# Patient Record
Sex: Male | Born: 1980 | Race: White | Hispanic: No | Marital: Single
Health system: Southern US, Community
[De-identification: ages and names within clinical notes are randomized; demographics above are authoritative.]

---

## 2001-06-01 ENCOUNTER — Emergency Department (HOSPITAL_COMMUNITY): Admission: EM | Admit: 2001-06-01 | Discharge: 2001-06-02 | Payer: Self-pay | Admitting: Emergency Medicine

## 2001-06-01 ENCOUNTER — Encounter: Payer: Self-pay | Admitting: Emergency Medicine

## 2002-11-26 ENCOUNTER — Emergency Department (HOSPITAL_COMMUNITY): Admission: EM | Admit: 2002-11-26 | Discharge: 2002-11-26 | Payer: Self-pay | Admitting: Emergency Medicine

## 2004-05-18 ENCOUNTER — Emergency Department (HOSPITAL_COMMUNITY): Admission: EM | Admit: 2004-05-18 | Discharge: 2004-05-18 | Payer: Self-pay | Admitting: Family Medicine

## 2006-06-08 IMAGING — CR DG ABDOMEN 1V
2 series · 2 of 2 positions shown · non-contrast
Comparison: none

CLINICAL DATA: Left-sided abdominal pain for approximately three weeks.  Throat sore. 
 SINGLE VIEW ABDOMEN:
 AP views of the abdomen show a moderate amount of fecal material within the right, transverse, and sigmoid colon regions.  No definite obstruction, perforation, mass, or abnormal calcifications is seen.  The lumbar spine shows mild focal lumbar scoliosis but no other abnormality.

[view not recorded (1 of 2)]
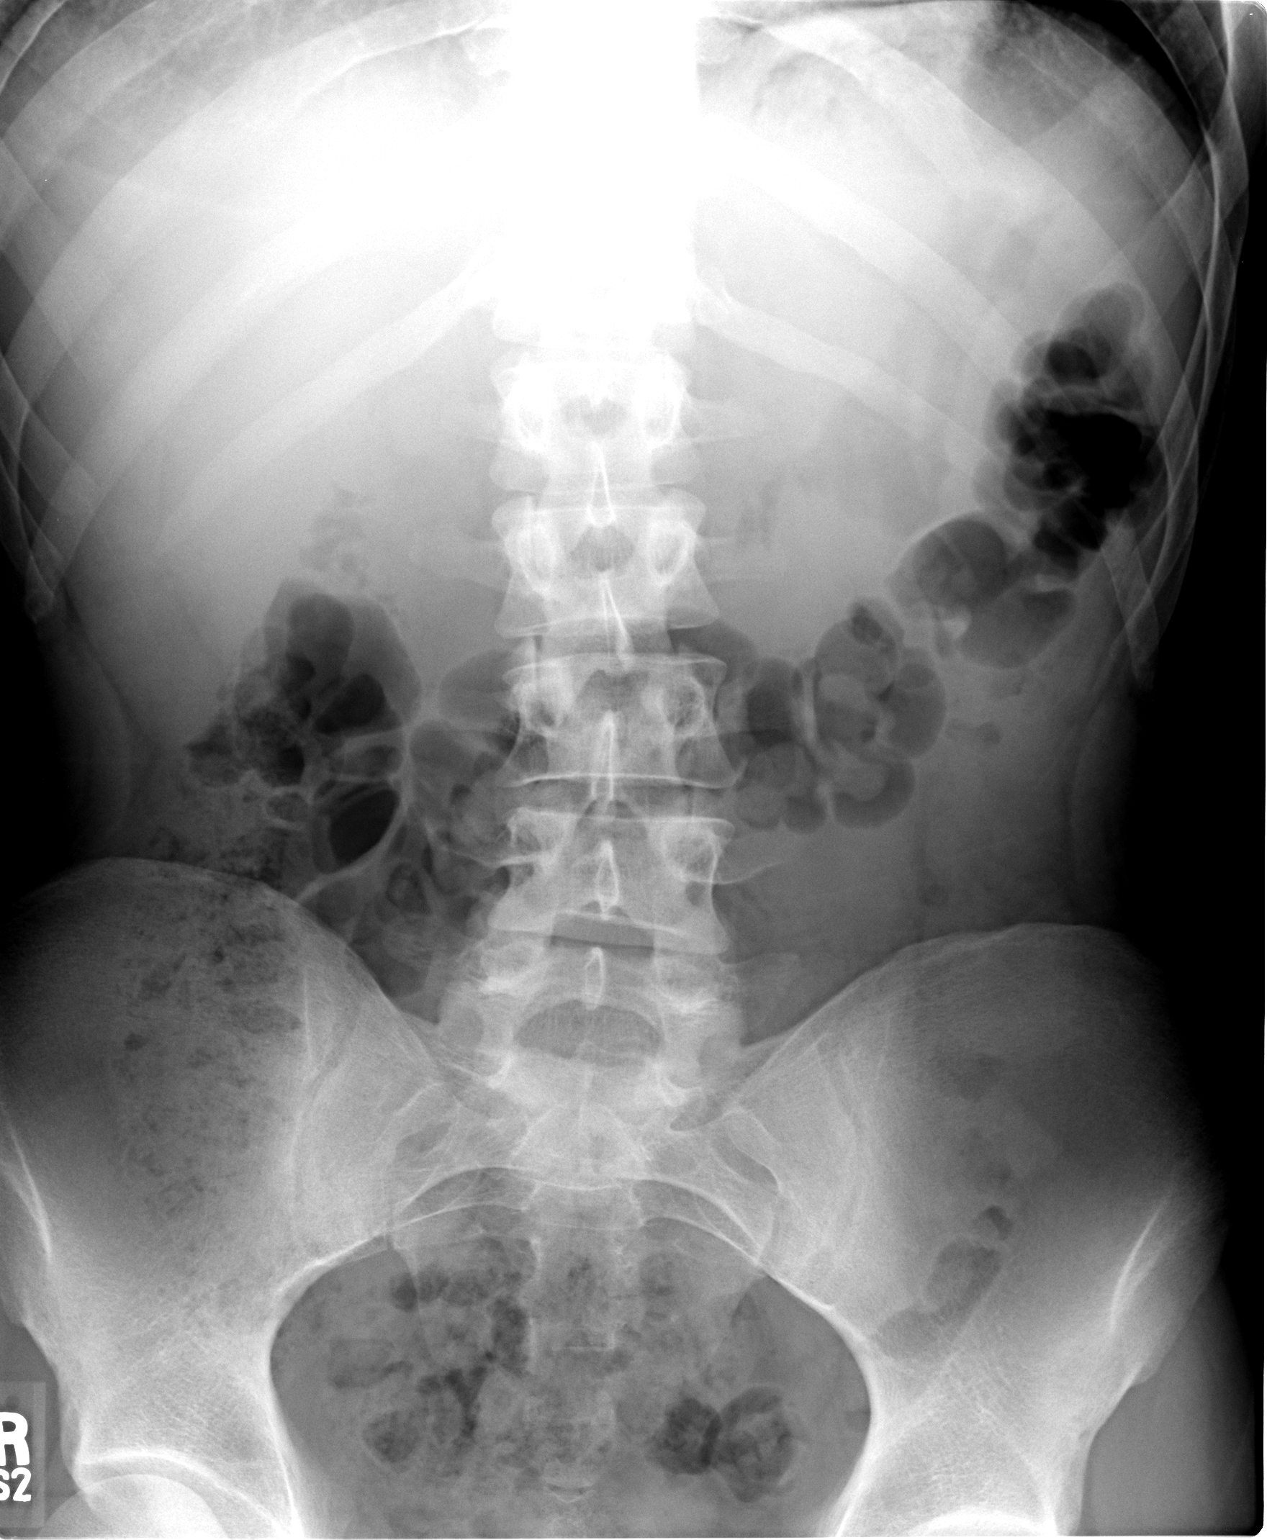

[view not recorded (2 of 2)]
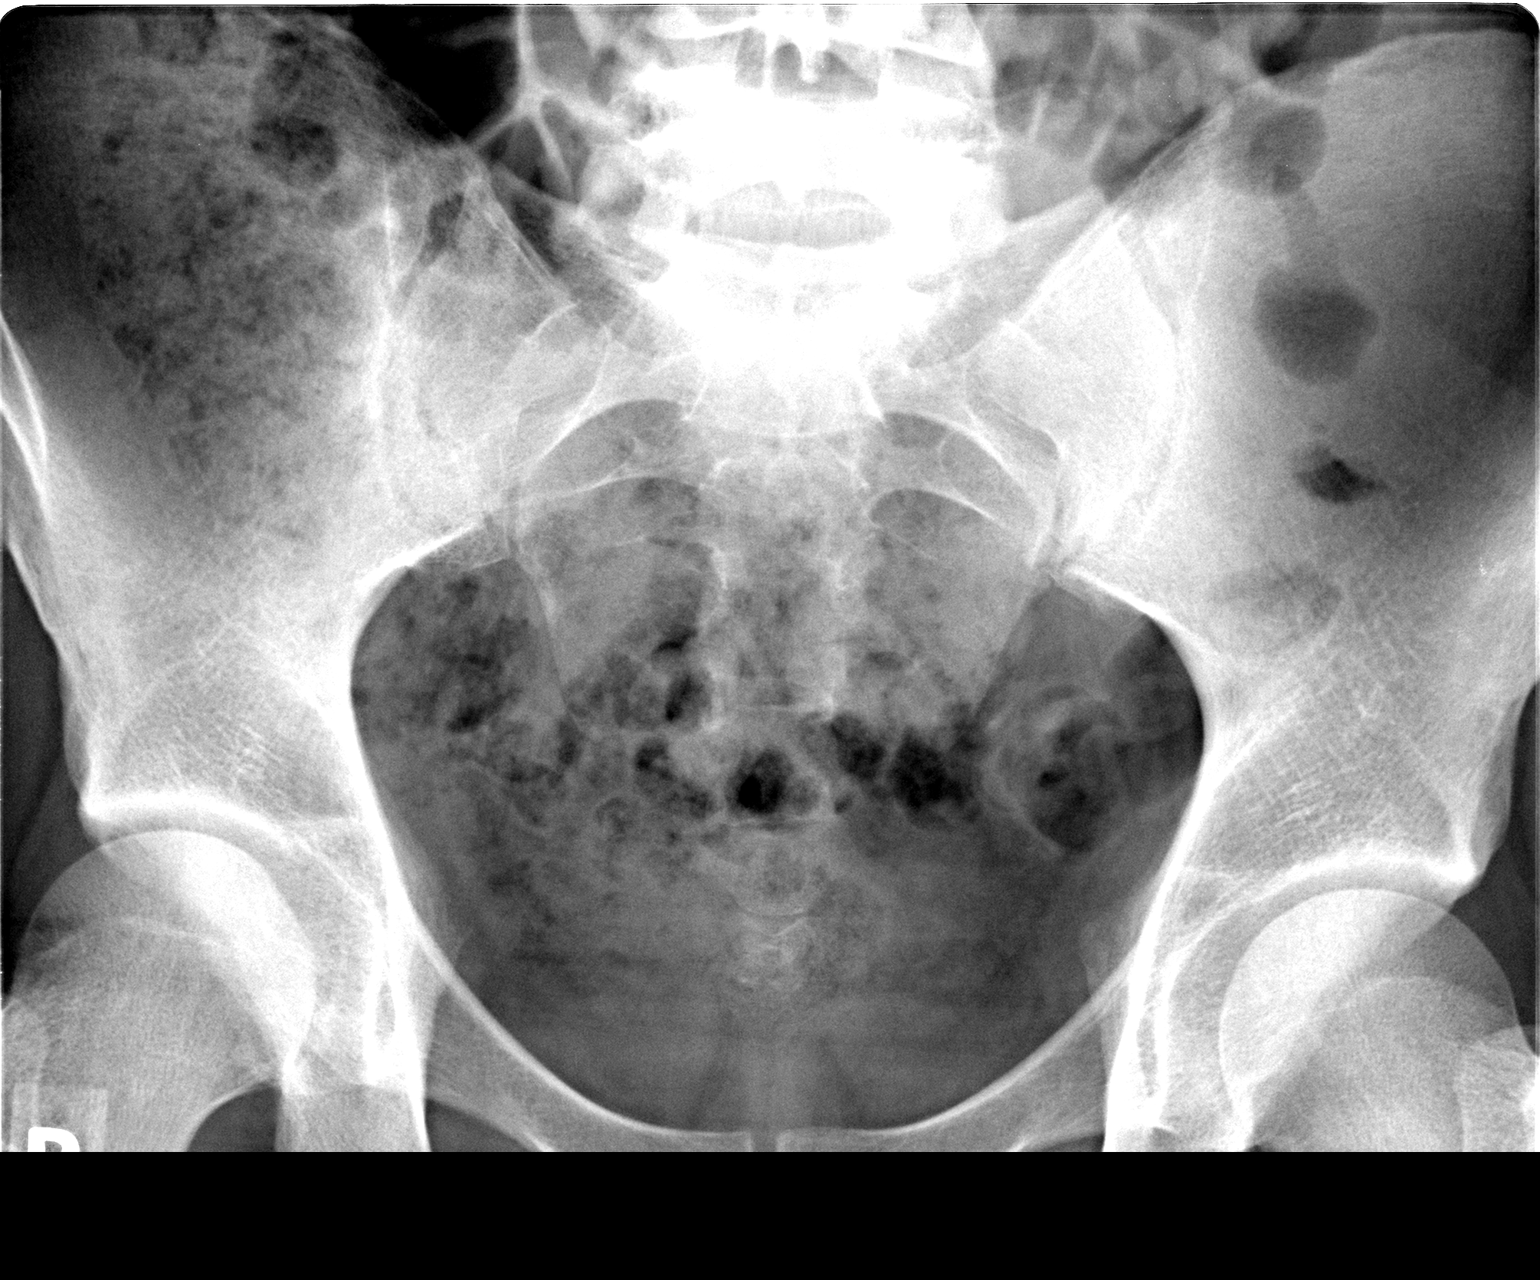

[2 of 2 positions shown; findings below may reference images not displayed]

IMPRESSION: Mildly increased fecal material in the right, transverse, and sigmoid colon.  No definite fecal impaction, obstruction, or free air.

## 2020-11-13 ENCOUNTER — Other Ambulatory Visit: Payer: Self-pay

## 2020-11-13 ENCOUNTER — Other Ambulatory Visit (HOSPITAL_COMMUNITY)
Admission: EM | Admit: 2020-11-13 | Discharge: 2020-11-17 | Disposition: A | Discharge status: Home / Self Care | Payer: No Payment, Other | Attending: Psychiatry | Admitting: Psychiatry

## 2020-11-13 ENCOUNTER — Encounter (HOSPITAL_COMMUNITY): Payer: Self-pay | Admitting: Registered Nurse

## 2020-11-13 DIAGNOSIS — F319 Bipolar disorder, unspecified: Secondary | ICD-10-CM | POA: Insufficient documentation

## 2020-11-13 DIAGNOSIS — F1094 Alcohol use, unspecified with alcohol-induced mood disorder: Secondary | ICD-10-CM | POA: Diagnosis present

## 2020-11-13 DIAGNOSIS — Z9151 Personal history of suicidal behavior: Secondary | ICD-10-CM | POA: Insufficient documentation

## 2020-11-13 DIAGNOSIS — F10239 Alcohol dependence with withdrawal, unspecified: Secondary | ICD-10-CM | POA: Insufficient documentation

## 2020-11-13 DIAGNOSIS — G4700 Insomnia, unspecified: Secondary | ICD-10-CM | POA: Insufficient documentation

## 2020-11-13 DIAGNOSIS — M545 Low back pain, unspecified: Secondary | ICD-10-CM | POA: Insufficient documentation

## 2020-11-13 DIAGNOSIS — Z91128 Patient's intentional underdosing of medication regimen for other reason: Secondary | ICD-10-CM | POA: Insufficient documentation

## 2020-11-13 DIAGNOSIS — F1024 Alcohol dependence with alcohol-induced mood disorder: Secondary | ICD-10-CM | POA: Insufficient documentation

## 2020-11-13 DIAGNOSIS — F1020 Alcohol dependence, uncomplicated: Secondary | ICD-10-CM | POA: Diagnosis not present

## 2020-11-13 DIAGNOSIS — R45851 Suicidal ideations: Secondary | ICD-10-CM

## 2020-11-13 DIAGNOSIS — Z20822 Contact with and (suspected) exposure to covid-19: Secondary | ICD-10-CM | POA: Diagnosis not present

## 2020-11-13 DIAGNOSIS — Z7982 Long term (current) use of aspirin: Secondary | ICD-10-CM | POA: Insufficient documentation

## 2020-11-13 DIAGNOSIS — F419 Anxiety disorder, unspecified: Secondary | ICD-10-CM | POA: Insufficient documentation

## 2020-11-13 DIAGNOSIS — T43596A Underdosing of other antipsychotics and neuroleptics, initial encounter: Secondary | ICD-10-CM | POA: Insufficient documentation

## 2020-11-13 DIAGNOSIS — K5900 Constipation, unspecified: Secondary | ICD-10-CM | POA: Insufficient documentation

## 2020-11-13 DIAGNOSIS — J45909 Unspecified asthma, uncomplicated: Secondary | ICD-10-CM | POA: Insufficient documentation

## 2020-11-13 LAB — COMPREHENSIVE METABOLIC PANEL
ALT: 15 U/L (ref 0–44)
AST: 22 U/L (ref 15–41)
Albumin: 4 g/dL (ref 3.5–5.0)
Alkaline Phosphatase: 63 U/L (ref 38–126)
Anion gap: 9 (ref 5–15)
BUN: 9 mg/dL (ref 6–20)
CO2: 26 mmol/L (ref 22–32)
Calcium: 9.1 mg/dL (ref 8.9–10.3)
Chloride: 106 mmol/L (ref 98–111)
Creatinine, Ser: 0.87 mg/dL (ref 0.61–1.24)
GFR, Estimated: >60 mL/min (ref >60)
Glucose, Bld: 88 mg/dL (ref 70–99)
Potassium: 4.1 mmol/L (ref 3.5–5.1)
Sodium: 141 mmol/L (ref 135–145)
Total Bilirubin: 0.4 mg/dL (ref 0.3–1.2)
Total Protein: 6.7 g/dL (ref 6.5–8.1)

## 2020-11-13 LAB — URINALYSIS, ROUTINE W REFLEX MICROSCOPIC
Bilirubin Urine: NEGATIVE
Glucose, UA: NEGATIVE mg/dL
Hgb urine dipstick: NEGATIVE
Ketones, ur: NEGATIVE mg/dL
Leukocytes,Ua: NEGATIVE
Nitrite: NEGATIVE
Protein, ur: NEGATIVE mg/dL
Specific Gravity, Urine: 1.017 (ref 1.005–1.030)
pH: 5 (ref 5.0–8.0)

## 2020-11-13 LAB — RESP PANEL BY RT-PCR (FLU A&B, COVID) ARPGX2
Influenza A by PCR: NEGATIVE
Influenza B by PCR: NEGATIVE
SARS Coronavirus 2 by RT PCR: NEGATIVE

## 2020-11-13 LAB — POCT URINE DRUG SCREEN - MANUAL ENTRY (I-SCREEN)
POC Buprenorphine (BUP): NOT DETECTED
POC Cocaine UR: NOT DETECTED
POC Marijuana UR: NOT DETECTED
POC Morphine: NOT DETECTED
POC Oxazepam (BZO): NOT DETECTED
POC Oxycodone UR: NOT DETECTED
POC Secobarbital (BAR): NOT DETECTED

## 2020-11-13 LAB — CBC WITH DIFFERENTIAL/PLATELET
Abs Immature Granulocytes: 0.03 10*3/uL (ref 0.00–0.07)
Basophils Absolute: 0 10*3/uL (ref 0.0–0.1)
Basophils Relative: 0 %
Eosinophils Absolute: 0.2 10*3/uL (ref 0.0–0.5)
Eosinophils Relative: 2 %
HCT: 46.7 % (ref 39.0–52.0)
Hemoglobin: 16 g/dL (ref 13.0–17.0)
Immature Granulocytes: 0 %
Lymphocytes Relative: 29 %
Lymphs Abs: 2.4 10*3/uL (ref 0.7–4.0)
MCH: 33.1 pg (ref 26.0–34.0)
MCHC: 34.3 g/dL (ref 30.0–36.0)
MCV: 96.7 fL (ref 80.0–100.0)
Monocytes Absolute: 0.4 10*3/uL (ref 0.1–1.0)
Monocytes Relative: 6 %
Neutro Abs: 5 10*3/uL (ref 1.7–7.7)
Neutrophils Relative %: 63 %
Platelets: 207 10*3/uL (ref 150–400)
RBC: 4.83 MIL/uL (ref 4.22–5.81)
RDW: 12.7 % (ref 11.5–15.5)
WBC: 8 10*3/uL (ref 4.0–10.5)
nRBC: 0 % (ref 0.0–0.2)

## 2020-11-13 LAB — LIPID PANEL
Cholesterol: 195 mg/dL (ref 0–200)
HDL: 89 mg/dL (ref >40)
LDL Cholesterol: 80 mg/dL (ref 0–99)
Total CHOL/HDL Ratio: 2.2 RATIO
Triglycerides: 129 mg/dL (ref <150)
VLDL: 26 mg/dL (ref 0–40)

## 2020-11-13 LAB — HEMOGLOBIN A1C
Hgb A1c MFr Bld: 5.4 % (ref 4.8–5.6)
Mean Plasma Glucose: 108.28 mg/dL

## 2020-11-13 LAB — POCT URINE DRUG SCREEN - MANUAL ENTRY (I-CUP)
POC Amphetamine UR: NOT DETECTED
POC Methadone UR: NOT DETECTED
POC Methamphetamine UR: NOT DETECTED

## 2020-11-13 LAB — ETHANOL: Alcohol, Ethyl (B): 13 mg/dL — ABNORMAL HIGH (ref <10)

## 2020-11-13 LAB — POC SARS CORONAVIRUS 2 AG -  ED: SARS Coronavirus 2 Ag: NEGATIVE

## 2020-11-13 LAB — POC SARS CORONAVIRUS 2 AG: SARSCOV2ONAVIRUS 2 AG: NEGATIVE

## 2020-11-13 LAB — TSH: TSH: 0.771 u[IU]/mL (ref 0.350–4.500)

## 2020-11-13 LAB — MAGNESIUM: Magnesium: 2.4 mg/dL (ref 1.7–2.4)

## 2020-11-13 MED ORDER — NICOTINE 21 MG/24HR TD PT24
21.0000 mg | MEDICATED_PATCH | Freq: Every day | TRANSDERMAL | Status: DC
  Administered 2020-11-13 – 2020-11-16 (×4): 21 mg via TRANSDERMAL
  Filled 2020-11-13 (×4): qty 1

## 2020-11-13 MED ORDER — ADULT MULTIVITAMIN W/MINERALS CH
1.0000 | ORAL_TABLET | Freq: Every day | ORAL | Status: DC
  Administered 2020-11-13 – 2020-11-17 (×5): 1 via ORAL
  Filled 2020-11-13 (×5): qty 1

## 2020-11-13 MED ORDER — THIAMINE HCL 100 MG PO TABS
100.0000 mg | ORAL_TABLET | Freq: Every day | ORAL | Status: DC
  Administered 2020-11-14 – 2020-11-17 (×4): 100 mg via ORAL
  Filled 2020-11-13 (×4): qty 1

## 2020-11-13 MED ORDER — LORAZEPAM 1 MG PO TABS
1.0000 mg | ORAL_TABLET | Freq: Two times a day (BID) | ORAL | Status: AC
  Administered 2020-11-16 (×2): 1 mg via ORAL
  Filled 2020-11-13 (×2): qty 1

## 2020-11-13 MED ORDER — ALUM & MAG HYDROXIDE-SIMETH 200-200-20 MG/5ML PO SUSP: 30.0000 mL | ORAL | Status: DC | PRN

## 2020-11-13 MED ORDER — ONDANSETRON 4 MG PO TBDP: 4.0000 mg | ORAL_TABLET | Freq: Four times a day (QID) | ORAL | Status: AC | PRN

## 2020-11-13 MED ORDER — THIAMINE HCL 100 MG/ML IJ SOLN
100.0000 mg | Freq: Once | INTRAMUSCULAR | Status: AC
  Administered 2020-11-13: 100 mg via INTRAMUSCULAR
  Filled 2020-11-13: qty 2

## 2020-11-13 MED ORDER — LORAZEPAM 1 MG PO TABS
1.0000 mg | ORAL_TABLET | Freq: Every day | ORAL | Status: AC
  Administered 2020-11-17: 1 mg via ORAL
  Filled 2020-11-13: qty 1

## 2020-11-13 MED ORDER — MAGNESIUM HYDROXIDE 400 MG/5ML PO SUSP
30.0000 mL | Freq: Every day | ORAL | Status: DC | PRN
Start: 2020-11-13 — End: 2020-11-17

## 2020-11-13 MED ORDER — LORAZEPAM 1 MG PO TABS
1.0000 mg | ORAL_TABLET | Freq: Four times a day (QID) | ORAL | Status: AC
  Administered 2020-11-13 – 2020-11-14 (×6): 1 mg via ORAL
  Filled 2020-11-13 (×6): qty 1

## 2020-11-13 MED ORDER — ACETAMINOPHEN 325 MG PO TABS: 650.0000 mg | ORAL_TABLET | Freq: Four times a day (QID) | ORAL | Status: DC | PRN

## 2020-11-13 MED ORDER — LORAZEPAM 1 MG PO TABS
1.0000 mg | ORAL_TABLET | Freq: Three times a day (TID) | ORAL | Status: AC
  Administered 2020-11-15 (×3): 1 mg via ORAL
  Filled 2020-11-13 (×3): qty 1

## 2020-11-13 MED ORDER — TRAZODONE HCL 50 MG PO TABS
50.0000 mg | ORAL_TABLET | Freq: Every evening | ORAL | Status: DC | PRN
  Administered 2020-11-14 – 2020-11-16 (×2): 50 mg via ORAL
  Filled 2020-11-13: qty 7
  Filled 2020-11-13 (×2): qty 1

## 2020-11-13 MED ORDER — LOPERAMIDE HCL 2 MG PO CAPS: 2.0000 mg | ORAL_CAPSULE | ORAL | Status: AC | PRN

## 2020-11-13 MED ORDER — HYDROXYZINE HCL 25 MG PO TABS: 25.0000 mg | ORAL_TABLET | Freq: Four times a day (QID) | ORAL | Status: AC | PRN

## 2020-11-13 MED ORDER — LORAZEPAM 1 MG PO TABS: 1.0000 mg | ORAL_TABLET | Freq: Four times a day (QID) | ORAL | Status: AC | PRN

## 2020-11-13 NOTE — Progress Notes (Signed)
   11/13/20 1506  [facility] Triage Screening (Walk-ins at [facility] only)  What Is the Reason for Your Visit/Call Today? Subustance Use  How Long Has This Been Causing You Problems? 1 wk - 1 month  Have You Recently Had Any Thoughts About Hurting Yourself? Yes  How long ago did you have thoughts about hurting yourself? Pt reports "I want to walk in front of train"  Are You Planning to Commit Suicide/Harm Yourself At This time? No  Have you Recently Had Thoughts About Hurting Someone Karolee Ohs? No  Are You Planning To Harm Someone At This Time? No  Are you currently experiencing any auditory, visual or other hallucinations? No  Have You Used Any Alcohol or Drugs in the Past 24 Hours? Yes  How long ago did you use Drugs or Alcohol? 01/14/20  What Did You Use and How Much? Alcohol  Do you have any current medical co-morbidities that require immediate attention? No  Clinician description of patient physical appearance/behavior: cooperative  What Do You Feel Would Help You the Most Today? Treatment for Depression or other mood problem;Alcohol or Drug Use Treatment  If access to [facility] was not available, would you have sought care in the Emergency Department? Yes  Determination of Need Urgent (48 hours)  Options For Referral Medication Management;Facility-Based Crisis

## 2020-11-13 NOTE — ED Notes (Signed)
Pt admitted to [facility] endorsing  passive SI, worsening depression with feelings of hopelessness. Patient denied SI,HI,AVH at present. Patient was cooperative during the admission assessment. Skin assessment complete. Belongings inventoried. Patient oriented to unit and unit rules. Meal and drinks offered to patient.  Patient verbalized agreement to treatment plans. Patient verbally contracts for safety while hospitalized. Will monitor for safety.

## 2020-11-13 NOTE — ED Notes (Signed)
Pt is sleeping in the bed. Respirations are even and unlabored. No acute distress noted. Will continue to monitor for safety. °

## 2020-11-13 NOTE — ED Provider Notes (Signed)
Behavioral Health Admission [facility]&P [facility] & OBS)  Date: 11/13/20 Patient Name: Tom Davenport MRN: [id number] Chief Complaint:  Chief Complaint  Patient presents with   Addiction Problem   Suicidal      Diagnoses:  Final diagnoses:  Alcohol use disorder, severe, dependence (HCC)  Alcohol-induced mood disorder with depressive symptoms (HCC)  Passive suicidal ideations    HPI: Tom Davenport, 40 y.o., male patient presents to [facility] as a walk-in accompanied by his mother with complaints of alcohol use disorder, worsening depression with feelings of hopelessness, and passive suicidal thoughts. Patient seen face to face by this provider, consulted with Dr. Earlene Plater; and chart reviewed on 11/13/20.  On evaluation Tom Davenport reports he came to urgent care today seeking help for his alcohol abuse.  "I've lost all hope of anything good.  I'm not able to handle it anymore."  Patient reports he does have a psychiatric history.  At one time he had outpatient psychiatric services at [facility].  States he was diagnosed with bipolar disorder and started on medication possibly Abilify that he has not taken in over 6 months.  Reports stopped taking medication because he felt the medication made him worse.  Patient reports he has been drinking daily for the last 6 months.  Reports he drinks a six pack a day through the week and at least to 18 packs of beer over the weekend.  Patient states he is employed at [employer] and a second job at [employer].  Patient lives in [place] with his nephew.  Denies prior rehab.  Patient does endorse that he has had 1 prior suicide attempt when he was a teenager "I had again it was going to blow my head off."  Patient reports he was afraid to carry out suicide attempt.  No prior psychiatric hospitalizations.  At this time patient denies homicidal ideation, psychosis, paranoia. During evaluation Tom Davenport is alert/oriented x 4; calm/cooperative; and  mood is congruent with affect.  He does not appear to be responding to internal/external stimuli or delusional thoughts.  Patient denies homicidal ideation, psychosis, and paranoia.  Continues to endorse passive suicidal thoughts such as walking in front of her trying but stating he would never carry it out because he is afraid to kill himself.  Patient answered question appropriately.  Patient's mother is at his side.  Family is supportive.  Mother stating she feels that he needs to get help and she is afraid that he would carry out a suicide attempt.  Reporting family never see them anymore.  PHQ 2-9:     Total Time spent with patient: 45 minutes  Musculoskeletal  Strength & Muscle Tone: within normal limits Gait & Station: normal Patient leans: N/A  Psychiatric Specialty Exam  Presentation General Appearance: Appropriate for Environment  Eye Contact:Good  Speech:Clear and Coherent; Normal Rate  Speech Volume:Normal  Handedness:Right   Mood and Affect  Mood:Anxious; Depressed; Hopeless  Affect:Congruent; Depressed   Thought Process  Thought Processes:Coherent; Goal Directed  Descriptions of Associations:Intact  Orientation:Full (Time, Place and Person)  Thought Content:WDL    Hallucinations:Hallucinations: None  Ideas of Reference:None  Suicidal Thoughts:Suicidal Thoughts: Yes, Passive SI Passive Intent and/or Plan: Without Intent; With Plan (Has thought about walking in front of train)  Homicidal Thoughts:Homicidal Thoughts: No   Sensorium  Memory:Immediate Good; Recent Good; Remote Good  Judgment:No data recorded Insight:Fair; Present   Executive Functions  Concentration:Good  Attention Span:Good  Recall:Good  Fund of Knowledge:Good  Language:No data recorded  Psychomotor Activity  Psychomotor Activity:Psychomotor Activity: Normal   Assets  Assets:Communication Skills; Desire for Improvement; Housing; Resilience; Social Support   Sleep   Sleep:Sleep: Fair   Nutritional Assessment (For OBS and [facility]BC admissions only) Has the patient had a weight loss or gain of 10 pounds or more in the last 3 months?: No Has the patient had a decrease in food intake/or appetite?: No Does the patient have dental problems?: No Does the patient have eating habits or behaviors that may be indicators of an eating disorder including binging or inducing vomiting?: No Has the patient recently lost weight without trying?: 0 Has the patient been eating poorly because of a decreased appetite?: 0 Malnutrition Screening Tool Score: 0    Physical Exam Vitals and nursing note reviewed. Exam conducted with a chaperone present.  Constitutional:      General: He is not in acute distress.    Appearance: Normal appearance. He is not ill-appearing.  Cardiovascular:     Rate and Rhythm: Normal rate.  Pulmonary:     Effort: Pulmonary effort is normal.  Musculoskeletal:        General: Normal range of motion.     Cervical back: Normal range of motion.  Skin:    General: Skin is warm and dry.  Neurological:     Mental Status: He is alert and oriented to person, place, and time.  Psychiatric:        Attention and Perception: Attention and perception normal. He does not perceive auditory or visual hallucinations.        Mood and Affect: Mood is anxious and depressed.        Speech: Speech normal.        Behavior: Behavior normal. Behavior is cooperative.        Thought Content: Thought content is not paranoid or delusional. Thought content does not include homicidal ideation. Suicidal: Passive suicidal thoughts.       Cognition and Memory: Cognition and memory normal.        Judgment: Judgment normal.   Review of Systems  Neurological:  Seizures: Denies.  Psychiatric/Behavioral:  Positive for depression and substance abuse (Alcohol.  Denies drug use). Negative for hallucinations (Denies). Suicidal ideas: Passive.The patient is nervous/anxious. Insomnia:  Sleep fair.   Blood pressure 126/74, pulse 97, temperature 98.4 F (36.9 C), temperature source Oral, resp. rate 16, SpO2 98 %. There is no height or weight on file to calculate BMI.  Past Psychiatric History: Reported previous diagnosis of bipolar disorder, depression, and alcohol use disorder  Is the patient at risk to self?  Reporting he is having suicidal thoughts but would never carry it out because he is afraid to kill himself Has the patient been a risk to self in the past 6 months? No .    Has the patient been a risk to self within the distant past? No   Is the patient a risk to others? No   Has the patient been a risk to others in the past 6 months? No   Has the patient been a risk to others within the distant past? No   Past Medical History: History reviewed. No pertinent past medical history. History reviewed. No pertinent surgical history.  Family History: History reviewed. No pertinent family history.  Social History:  Social History   Socioeconomic History   Marital status: Single    Spouse name: Not on file   Number of children: Not on file   Years of education: Not on  file   Highest education level: Not on file  Occupational History   Not on file  Tobacco Use   Smoking status: Not on file   Smokeless tobacco: Not on file  Substance and Sexual Activity   Alcohol use: Not on file   Drug use: Not on file   Sexual activity: Not on file  Other Topics Concern   Not on file  Social History Narrative   Not on file   Social Determinants of Health   Financial Resource Strain: Not on file  Food Insecurity: Not on file  Transportation Needs: Not on file  Physical Activity: Not on file  Stress: Not on file  Social Connections: Not on file  Intimate Partner Violence: Not on file    SDOH:  SDOH Screenings   Alcohol Screen: Not on file  Depression (PHQ2-9): Not on file  Financial Resource Strain: Not on file  Food Insecurity: Not on file  Housing: Not on  file  Physical Activity: Not on file  Social Connections: Not on file  Stress: Not on file  Tobacco Use: Not on file  Transportation Needs: Not on file    Last Labs:  No visits with results within 6 Month(s) from this visit.  Latest known visit with results is:  No results found for any previous visit.    Allergies: Patient has no allergy information on record.  PTA Medications: (Not in a hospital admission)   Medical Decision Making  Patient admitted to facility base crisis for safety, stabilization, and alcohol detox for withdrawal symptoms.  Lab Orders         Resp Panel by RT-PCR (Flu A&B, Covid) Nasopharyngeal Swab         CBC with Differential/Platelet         Comprehensive metabolic panel         Hemoglobin A1c         Magnesium         Ethanol         Lipid panel         TSH         Urinalysis, Routine w reflex microscopic Urine, Clean Catch         POC SARS Coronavirus 2 Ag-ED - Nasal Swab         POCT Urine Drug Screen - ([facility])       Medication Management: Meds ordered this encounter  Medications   acetaminophen (TYLENOL) tablet 650 mg   alum & mag hydroxide-simeth (MAALOX/MYLANTA) 200-200-20 MG/5ML suspension 30 mL   magnesium hydroxide (MILK OF MAGNESIA) suspension 30 mL   traZODone (DESYREL) tablet 50 mg   nicotine (NICODERM CQ - dosed in mg/24 hours) patch 21 mg   thiamine (B-1) injection 100 mg   thiamine tablet 100 mg   multivitamin with minerals tablet 1 tablet   LORazepam (ATIVAN) tablet 1 mg   hydrOXYzine (ATARAX/VISTARIL) tablet 25 mg   loperamide (IMODIUM) capsule 2-4 mg   ondansetron (ZOFRAN-ODT) disintegrating tablet 4 mg   FOLLOWED BY Linked Order Group    LORazepam (ATIVAN) tablet 1 mg    LORazepam (ATIVAN) tablet 1 mg    LORazepam (ATIVAN) tablet 1 mg    LORazepam (ATIVAN) tablet 1 mg     Recommendations  Based on my evaluation the patient does not appear to have an emergency medical condition. Admission to [place] [facility]  for safety, stabilization, and alcohol detox.  Social work to assist patient with rehab and follow up services  Anthem Frazer, NP 11/13/20  4:52 PM

## 2020-11-13 NOTE — Discharge Instructions (Addendum)
   Please come to [facility] (this facility) during walk in hours for appointment with psychiatrist for further medication management and for therapists for therapy.    Walk in hours are 8-11 AM Monday through Thursday for medication management. Therapy walk in hours are Monday-Wednesday 8 AM-1PM.   It is first come, first -serve; it is best to arrive by 7:00 AM.   On Friday from 1 pm to 4 pm for therapy intake only. Please arrive by 12:00 pm as it is  first come, first -serve.    When you arrive please go upstairs for your appointment. If you are unsure of where to go, inform the front desk that you are here for a walk in appointment and they will assist you with directions upstairs.  Address:  [address], in [place], [address] Ph: [phone]   

## 2020-11-13 NOTE — ED Provider Notes (Signed)
[facility] Admission Suicide Risk Assessment   Nursing information obtained from:   From patient Demographic factors:   Patient lives in [place] with his nephew.   Current Mental Status:   Alert oriented, depressed and feeling hopeless Loss Factors:   N/A Historical Factors:   Chronic history of alcohol abuse worsening over the last 6 months Risk Reduction Factors:   Positive support system, employed, living with family, responsibility to family  Total Time spent with patient: 30 minutes Principal Problem: Alcohol-induced mood disorder with depressive symptoms (HCC) Diagnosis:  Principal Problem:   Alcohol-induced mood disorder with depressive symptoms (HCC) Active Problems:   Alcohol use disorder, severe, dependence (HCC)   Passive suicidal ideations  Subjective Data: Tom Davenport, 40 y.o., male patient presents to [facility] as a walk-in accompanied by his mother with complaints of alcohol use disorder, worsening depression with feelings of hopelessness, and passive suicidal thoughts. Patient seen face to face by this provider, consulted with Dr. Earlene Plater; and chart reviewed on 11/13/20.  On evaluation Tom Davenport reports he came to urgent care today seeking help for his alcohol abuse.  "I've lost all hope of anything good.  I'm not able to handle it anymore."  Patient reports he does have a psychiatric history.  At one time he had outpatient psychiatric services at [facility].  States he was diagnosed with bipolar disorder and started on medication possibly Abilify that he has not taken in over 6 months.  Reports stopped taking medication because he felt the medication made him worse.  Patient reports he has been drinking daily for the last 6 months.  Reports he drinks a six pack a day through the week and at least to 18 packs of beer over the weekend.  Patient states he is employed at [employer] and a second job at [employer].  Patient lives in [place] with his nephew.  Denies  prior rehab.  Patient does endorse that he has had 1 prior suicide attempt when he was a teenager "I had again it was going to blow my head off."  Patient reports he was afraid to carry out suicide attempt.  No prior psychiatric hospitalizations.  At this time patient denies homicidal ideation, psychosis, paranoia. During evaluation Carthel Castille is alert/oriented x 4; calm/cooperative; and mood is congruent with affect.  He does not appear to be responding to internal/external stimuli or delusional thoughts.  Patient denies homicidal ideation, psychosis, and paranoia.  Continues to endorse passive suicidal thoughts such as walking in front of her trying but stating he would never carry it out because he is afraid to kill himself.  Patient answered question appropriately.  Patient's mother is at his side.  Family is supportive.  Mother stating she feels that he needs to get help and she is afraid that he would carry out a suicide attempt.  Reporting family never see them anymore.  Continued Clinical Symptoms:    The "Alcohol Use Disorders Identification Test", Guidelines for Use in Primary Care, Second Edition.  World Science writer [facility]). Score between 0-7:  no or low risk or alcohol related problems. Score between 8-15:  moderate risk of alcohol related problems. Score between 16-19:  high risk of alcohol related problems. Score 20 or above:  warrants further diagnostic evaluation for alcohol dependence and treatment.   CLINICAL FACTORS:   Depression:   Anhedonia Hopelessness Alcohol/Substance Abuse/Dependencies   Musculoskeletal: Strength & Muscle Tone: within normal limits Gait & Station: normal Patient leans: N/A  Psychiatric Specialty Exam:  Presentation  General Appearance: Appropriate for Environment  Eye Contact:Good  Speech:Clear and Coherent; Normal Rate  Speech Volume:Normal  Handedness:Right   Mood and Affect  Mood:Anxious; Depressed;  Hopeless  Affect:Congruent; Depressed   Thought Process  Thought Processes:Coherent; Goal Directed  Descriptions of Associations:Intact  Orientation:Full (Time, Place and Person)  Thought Content:WDL  History of Schizophrenia/Schizoaffective disorder:No  Duration of Psychotic Symptoms:No data recorded Hallucinations:Hallucinations: None  Ideas of Reference:None  Suicidal Thoughts:Suicidal Thoughts: Yes, Passive SI Passive Intent and/or Plan: Without Intent; With Plan (Has thought about walking in front of train)  Homicidal Thoughts:Homicidal Thoughts: No   Sensorium  Memory:Immediate Good; Recent Good; Remote Good  Judgment:No data recorded Insight:Fair; Present   Executive Functions  Concentration:Good  Attention Span:Good  [place] recorded  Psychomotor Activity  Psychomotor Activity:Psychomotor Activity: Normal   Assets  Assets:Communication Skills; Desire for Improvement; Housing; Resilience; Social Support   Sleep  Sleep:Sleep: Fair    Physical Exam: Physical Exam Vitals and nursing note reviewed. Exam conducted with a chaperone present.  Constitutional:      General: He is not in acute distress.    Appearance: Normal appearance. He is not ill-appearing.  Cardiovascular:     Rate and Rhythm: Normal rate.  Pulmonary:     Effort: Pulmonary effort is normal.  Musculoskeletal:        General: Normal range of motion.     Cervical back: Normal range of motion.  Skin:    General: Skin is warm and dry.  Neurological:     Mental Status: He is alert and oriented to person, place, and time.  Psychiatric:        Attention and Perception: Attention and perception normal. He does not perceive auditory or visual hallucinations.        Mood and Affect: Mood is anxious and depressed.        Speech: Speech normal.        Behavior: Behavior normal. Behavior is cooperative.        Thought Content: Thought  content is not paranoid or delusional. Thought content does not include homicidal ideation. Suicidal: Passive suicidal thoughts.       Cognition and Memory: Cognition and memory normal.        Judgment: Judgment normal.   Review of Systems  Neurological:  Seizures: Denies.  Psychiatric/Behavioral:  Positive for depression and substance abuse (Alcohol.  Denies drug use). Negative for hallucinations (Denies). Suicidal ideas: Passive.The patient is nervous/anxious. Insomnia: Sleep fair.  Blood pressure 126/74, pulse 97, temperature 98.4 F (36.9 C), temperature source Oral, resp. rate 16, SpO2 98 %. There is no height or weight on file to calculate BMI.   COGNITIVE FEATURES THAT CONTRIBUTE TO RISK:  None    SUICIDE RISK:   Mild:  Suicidal ideation of limited frequency, intensity, duration, and specificity.  There are no identifiable plans, no associated intent, mild dysphoria and related symptoms, good self-control (both objective and subjective assessment), few other risk factors, and identifiable protective factors, including available and accessible social support.  PLAN OF CARE: Admission to facility base crisis unit for safety, stabilization, alcohol withdrawal.  I certify that inpatient services furnished can reasonably be expected to improve the patient's condition.   Velina Drollinger, NP 11/13/2020, 5:13 PM

## 2020-11-13 NOTE — Progress Notes (Signed)
Patient cooperative with admission process.  Denied SI, HI, AVH.  Reported having had SI [place] "I couldn't do it, but it crossed through my mind."  Patient reported drinking a 6 pack of beer during the week and a 12 pack on weekends.  Stated he had a 12 pack this morning between 0630 and 0900.  Patient stated he quit about a year ago and denied withdrawal symptoms at that time. Will continue to monitor for safety.

## 2020-11-13 NOTE — ED Notes (Signed)
Pt A&O x 4, resting at present, watching TV at present.  Calm & cooperative, pending transfer to [facility].

## 2020-11-13 NOTE — BH Assessment (Signed)
Comprehensive Clinical Assessment (CCA) Note  11/13/2020 Tom Davenport [id number]  Disposition: Per Tom Newport, NP, patient is recommended admission to [facility].   [place] ED from 11/13/2020 in [place] Moderate Risk      The patient demonstrates the following risk factors for suicide: Chronic risk factors for suicide include: psychiatric disorder of bipolar and substance use disorder. Acute risk factors for suicide include: N/A. Protective factors for this patient include: positive social support and responsibility to others (children, family). Considering these factors, the overall suicide risk at this point appears to be moderate. Patient is not appropriate for outpatient follow up.   Tom Davenport is a 40 year old male presenting to [facility] voluntarily with his mom reporting daily alcohol use for the past six months and SI without a plan however, [place]. Patient reports feeling hopeless due to alcohol use. Patient reports he was diagnosed with bipolar disorder by [facility] a year ago and was taking medications. Patient reports he has been off his medications for the past six months.  Patient reports symptoms of isolating, anhedonia, crying, feeling irritable, hopeless, poor sleep and having SI. Patient denies history of rehab or inpatient treatment. Patient reports living with his nephew and his grandfather and reports working at [employer] third shift. Despite alcohol use and worsening mental health symptoms patient has continued to go to work. Patient reports drinking a six pack of beer a day on the weekdays and two 18 pack of beer daily on the weekends. Patient reports SI with a vague plan. Patient reports he came close to committing suicide by using a gun when he was a teenager. Patient reports he is too afraid to kill himself. Patient denies HI, AVH and history of seizures.    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 Self  What Is the Reason for Your Visit/Call Today? Passive SI, alcohol problem, worsening depression  How Long Has This Been Causing You Problems? 1-6 months  What Do You Feel Would Help You the Most Today? Treatment for Depression or other mood problem; Alcohol or Drug Use Treatment   Have You Recently Had Any Thoughts About Hurting Yourself? Yes  Are You Planning to Commit Suicide/Harm Yourself At This time? No (Pt reports having railroad tracks close to his house)   Have you Recently Had Thoughts About [place]? No  Are You Planning to Harm Someone at This Time? No  Explanation: No data recorded  Have You Used Any Alcohol or Drugs in the Past 24 Hours? Yes  How Long Ago Did You Use Drugs or Alcohol? No data recorded What Did You Use and How Much? Alcohol   Do You Currently Have a Therapist/Psychiatrist? No data recorded Name of Therapist/Psychiatrist: No data recorded  Have You Been Recently Discharged From Any Office Practice or Programs? No data recorded Explanation of Discharge From Practice/Program: No data recorded    CCA Screening Triage Referral Assessment Type of Contact: No data recorded Telemedicine Service Delivery:   Is this Initial or Reassessment? No data recorded Date Telepsych consult ordered in [facility]:  No data recorded Time Telepsych consult ordered in [facility]:  No  data recorded Location of Assessment: No data recorded Provider Location: No data recorded  Collateral Involvement: No data recorded  Does Patient Have a Court Appointed Legal Guardian? No data recorded Name and Contact of Legal Guardian: No data recorded If  Minor and Not Living with Parent(s), Who has Custody? No data recorded Is CPS involved or ever been involved? No data recorded Is APS involved or ever been involved? No data recorded  Patient Determined To Be At Risk for Harm To Self or Others Based on Review of Patient Reported Information or Presenting Complaint? No data recorded Method: No data recorded Availability of Means: No data recorded Intent: No data recorded Notification Required: No data recorded Additional Information for Danger to Others Potential: No data recorded Additional Comments for Danger to Others Potential: No data recorded Are There Guns or Other Weapons in Your Home? No data recorded Types of Guns/Weapons: No data recorded Are These Weapons Safely Secured?                            No data recorded Who Could Verify You Are Able To Have These Secured: No data recorded Do You Have any Outstanding Charges, Pending Court Dates, Parole/Probation? No data recorded Contacted To Inform of Risk of Harm To Self or Others: No data recorded   Does Patient Present under Involuntary Commitment? No data recorded IVC Papers Initial File Date: No data recorded  [place] of Residence: No data recorded  Patient Currently Receiving the Following Services: No data recorded  Determination of Need: Urgent (48 hours)   Options For Referral: Medication Management; Outpatient Therapy; Facility-Based Crisis     CCA Biopsychosocial Patient Reported Schizophrenia/Schizoaffective Diagnosis in Past: No   Strengths: No data recorded  Mental Health Symptoms Depression:   Change in energy/activity; Difficulty Concentrating; Fatigue; Hopelessness; Increase/decrease in appetite; Irritability; Sleep (too much or little); Tearfulness; Worthlessness   Duration of Depressive symptoms:  Duration of Depressive Symptoms: Greater than two weeks   Mania:   None   Anxiety:    Worrying; Tension; Irritability   Psychosis:   None    Duration of Psychotic symptoms:    Trauma:   None   Obsessions:   None   Compulsions:   None   Inattention:   None   Hyperactivity/Impulsivity:   None   Oppositional/Defiant Behaviors:   None   Emotional Irregularity:   None   Other Mood/Personality Symptoms:  No data recorded   Mental Status Exam Appearance and self-care  Stature:   Tall   Weight:   Average weight   Clothing:   Dirty   Grooming:   Neglected   Cosmetic use:   None   Posture/gait:   Normal   Motor activity:   Not Remarkable   Sensorium  Attention:   Normal   Concentration:   Normal   Orientation:   Person; Place; Situation   Recall/memory:   Normal   Affect and Mood  Affect:   Appropriate   Mood:   Anxious   Relating  Eye contact:   Normal   Facial expression:   Responsive   Attitude toward examiner:   Cooperative   Thought and Language  Speech flow:  Clear and Coherent   Thought content:   Appropriate to Mood and Circumstances   Preoccupation:   None   Hallucinations:   None   Organization:  No data recorded  [employer] of Knowledge:   Good  Intelligence:   Average   Abstraction:   Normal   Judgement:   Fair   Art therapist:   Adequate   Insight:   Fair   Decision Making:   Normal   Social Functioning  Social Maturity:   Isolates   Social Judgement:   Normal   Stress  Stressors:   Housing   Coping Ability:   Overwhelmed; Exhausted   Skill Deficits:  No data recorded  Supports:   Family     Religion:    Leisure/Recreation:    Exercise/Diet: Exercise/Diet Do You Have Any Trouble Sleeping?: No   CCA Employment/Education Employment/Work Situation: Employment / Work Situation Employment Situation: Employed Work Stressors: none Patient's Job has Been Impacted by Current Illness: No  Education: Education Is Patient Currently Attending School?: No   CCA Family/Childhood  History Family and Relationship History:    Childhood History:  Childhood History By whom was/is the patient raised?: Both parents  Child/Adolescent Assessment:     CCA Substance Use Alcohol/Drug Use: Alcohol / Drug Use Pain Medications: See MAR Prescriptions: See MAR Over the Counter: See MAR History of alcohol / drug use?: Yes Longest period of sobriety (when/how long): 1 year Substance #1 Name of Substance 1: ETOH 1 - Age of First Use: Teenage 1 - Amount (size/oz): 6 pack on weekdays, 2 18 pack of beer on weekends 1 - Frequency: daily 1 - Duration: 6 months                       ASAM's:  Six Dimensions of Multidimensional Assessment  Dimension 1:  Acute Intoxication and/or Withdrawal Potential:      Dimension 2:  Biomedical Conditions and Complications:      Dimension 3:  Emotional, Behavioral, or Cognitive Conditions and Complications:     Dimension 4:  Readiness to Change:     Dimension 5:  Relapse, Continued use, or Continued Problem Potential:     Dimension 6:  Recovery/Living Environment:     ASAM Severity Score:    ASAM Recommended Level of Treatment: ASAM Recommended Level of Treatment: Level II Intensive Outpatient Treatment   Substance use Disorder (SUD) Substance Use Disorder (SUD)  Checklist Symptoms of Substance Use: Continued use despite having a persistent/recurrent physical/psychological problem caused/exacerbated by use, Evidence of tolerance, Persistent desire or unsuccessful efforts to cut down or control use, Presence of craving or strong urge to use, Social, occupational, recreational activities given up or reduced due to use, Substance(s) often taken in larger amounts or over longer times than was intended  Recommendations for Services/Supports/Treatments: Recommendations for Services/Supports/Treatments Recommendations For Services/Supports/Treatments: Facility Based Crisis, Inpatient Hospitalization, Detox  Discharge Disposition:     DSM5 Diagnoses: Patient Active Problem List   Diagnosis Date Noted   Alcohol use disorder, severe, dependence ([place]) 11/13/2020   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

## 2020-11-13 NOTE — BH Assessment (Signed)
Tom Davenport, Routine, MR # [id number]; 40 years old presents this date with his mother Rivka Barbara, [phone].  Pt reports SI, "I want to walk in front of a train".  Pt denied HI or AVH.  Pt says he has been drinking alcohol daily.  Pt admits to MH diagnosis; also is not taking prescribed medication for symptom management.  MSE signed by patient.

## 2020-11-14 ENCOUNTER — Encounter (HOSPITAL_COMMUNITY): Payer: Self-pay | Admitting: Registered Nurse

## 2020-11-14 DIAGNOSIS — R45851 Suicidal ideations: Secondary | ICD-10-CM | POA: Diagnosis not present

## 2020-11-14 DIAGNOSIS — F1024 Alcohol dependence with alcohol-induced mood disorder: Secondary | ICD-10-CM | POA: Diagnosis not present

## 2020-11-14 DIAGNOSIS — F1020 Alcohol dependence, uncomplicated: Secondary | ICD-10-CM | POA: Diagnosis not present

## 2020-11-14 DIAGNOSIS — Z20822 Contact with and (suspected) exposure to covid-19: Secondary | ICD-10-CM | POA: Diagnosis not present

## 2020-11-14 MED ORDER — MIRTAZAPINE 15 MG PO TABS
15.0000 mg | ORAL_TABLET | Freq: Every day | ORAL | Status: DC
  Administered 2020-11-14 – 2020-11-15 (×2): 15 mg via ORAL
  Filled 2020-11-14 (×2): qty 1

## 2020-11-14 MED ORDER — ASPIRIN EC 81 MG PO TBEC
81.0000 mg | DELAYED_RELEASE_TABLET | Freq: Every day | ORAL | Status: DC
  Administered 2020-11-14 – 2020-11-17 (×4): 81 mg via ORAL
  Filled 2020-11-14 (×2): qty 1
  Filled 2020-11-14: qty 7
  Filled 2020-11-14 (×2): qty 1

## 2020-11-14 NOTE — ED Notes (Signed)
Pt was teary when staff went to the room to do assessment. Patient stated "I am thinking about life". Emotional support given by staff. Patient denies SI,HI,AVH. Respirations were even and unlabored. No complaints at present. Will continue to monitor for safety.

## 2020-11-14 NOTE — ED Notes (Signed)
Didn't attend group 

## 2020-11-14 NOTE — ED Provider Notes (Signed)
Behavioral Health Progress Note  Date and Time: 11/14/2020 12:33 PM Name: Tom Davenport MRN:  [id number]  Subjective:   40 year old male with history of alcohol abuse, depression, anxiety and self-reported bipolar disorder who presented to the [facility] on 11/7 with passive suicidal ideations and desire for alcohol treatment.  Patient was admitted to the [facility] for alcohol detox and further treatment.  He was started on Ativan taper and alcohol withdrawal protocol.  Patient seen and chart reviewed-patient has been medication compliant and has not needed any additional Ativan for alcohol withdrawal symptoms.  Most recent CIWA was 0.  Patient states that he has been drinking approximately a 6 pack of beer a day for the last 6 months.  Patient states that he was diagnosed with bipolar disorder last year for the first time and was prescribed Wellbutrin and Abilify.  Patient states he ultimately stopped taking this medication as he did not like the way the medications made them made him feel.  Patient describes experiencing akathisia with Abilify which also led to discontinuation.  Prior to being prescribed medications for  6 month period,  he had been drinking daily for about 5 to 6 years.  Describes a long history of substance use and states that he has been using recreational substances since he was 16 up until currently.  Patient denies using any substance currently apart from alcohol recently but has reported using other substances in the past.  Patient states that his mood has been "depressed" for years.  Patient endorses depressive symptoms of anhedonia, guilt, decreased energy, difficulty concentrating, decreased appetite (states that he lost 10 pounds over the last 3 months).  Patient also states that he has difficulty sleeping and describes experiencing ruminations when attempting to sleep.  Patient reports frequent nighttime awakenings as well as difficulty going to sleep.  Patient states that he will often  drink to assist with him going to sleep.     In terms of his previous diagnoses of bipolar disorder, he states that it was a diagnosis that he was given within the last 2 years.  Patient states that he had never been diagnosed with bipolar disorder prior to this.  By description it does not appear that he has ever had a true manic or hypomanic episode  Psychoeducation performed with patient.  Patient agreed that he did not have the constellation of symptoms that would be necessary for diagnosis of  mania/hypomania (DIGFAST) within the appropriate timeframe and being sober since.  Patient states that prior to presenting to the group at 630 drink a 12 pack of beer and was sitting in his car.  Patient states he then texted his mother and apologized for being distant recently and admitted to feeling depressed.  Patient states that his mother then came to his home and brought him to the [facility] for assistance that she wanted him to get some help.  Patient denies SI/HI/AVH.  Patient denies Parramore paranoia/thought insertion/thought withdrawal/thought broadcasting/grandiosity.  Patient reports current alcohol withdrawal symptoms of tremor, headache, and anxiety.  He denies diaphoresis, palpitations, GI upset, nausea and vomiting.  Patient states that he would like to finish detox although was uncertain about what he would like to do in terms of follow-up appointments.  Patient states he has been to AA in the past because he was "forced to" but states that made him uncomfortable as it was in a group setting.  Discussed possible IOP/PHP for substance abuse however patient also declined indicating that he does not like  some group settings.  Patient states that he will think about what he would like to do for follow-up.  Discussed starting medications-patient is agreeable to a trial of Remeron for assistance with sleep, anxiety and insomnia.  Reports a history of asthma when he was younger and states that he is a  "blood clot" in his leg and states that he had been instructed to take aspirin daily.  He indicates that he has not used inhaler recently.  He denies histories of seizures.  He states that he does not have a current primary care physician and indicates that he would like referral on discharge.  Past Psychiatric History: Previous Medication Trials: abilify, wellbutrin and other medication that he is unable to recall the names Previous Psychiatric Hospitalizations: no Previous Suicide Attempts: no true attempt, although states that he held a gun to his head when younger but did not go through with it History of Violence: denies Outpatient psychiatrist: no  Social History: Marital Status: not married Children: 0 Source of Income: works for [employer] and also works at [employer]" Education:  GED Special Ed: no Housing Status: with nephew in [place] History of phys/sexual abuse: denies Easy access to gun: denies  Substance Use (with emphasis over the last 12 months) Recreational Drugs: denies current use of substances apart from etoh Use of Alcohol:  yes, daily. See HPI Tobacco Use: yes Rehab History: no H/O Complicated Withdrawal: no  Legal History: Past Charges/Incarcerations: yes, states he was charged when he was 40yo, does not provide details but indicates that he was "under the influence" Pending charges: denies  Family Psychiatric History: Denies h/o mental illness. Denies h/o substance use. Denies h/o attempted or completed suicudes    Final diagnoses:  Alcohol use disorder, severe, dependence (HCC)  Alcohol-induced mood disorder with depressive symptoms (HCC)  Passive suicidal ideations    Total Time spent with patient: 45 minutes   Past Medical History: History reviewed. No pertinent past medical history. History reviewed. No pertinent surgical history. Family History: History reviewed. No pertinent family history.  Social History:  Social History    Substance and Sexual Activity  Alcohol Use None     Social History   Substance and Sexual Activity  Drug Use Not on file    Social History   Socioeconomic History   Marital status: Single    Spouse name: Not on file   Number of children: Not on file   Years of education: Not on file   Highest education level: Not on file  Occupational History   Not on file  Tobacco Use   Smoking status: Not on file   Smokeless tobacco: Not on file  Substance and Sexual Activity   Alcohol use: Not on file   Drug use: Not on file   Sexual activity: Not on file  Other Topics Concern   Not on file  Social History Narrative   Not on file   Social Determinants of Health   Financial Resource Strain: Not on file  Food Insecurity: Not on file  Transportation Needs: Not on file  Physical Activity: Not on file  Stress: Not on file  Social Connections: Not on file   SDOH:  SDOH Screenings   Alcohol Screen: Not on file  Depression (ZOX0-9): Not on file  Financial Resource Strain: Not on file  Food Insecurity: Not on file  Housing: Not on file  Physical Activity: Not on file  Social Connections: Not on file  Stress: Not on  file  Tobacco Use: Not on file  Transportation Needs: Not on file   Additional Social History:    Pain Medications: See MAR Prescriptions: See MAR Over the Counter: See MAR History of alcohol / drug use?: Yes Longest period of sobriety (when/how long): 1 year Name of Substance 1: ETOH 1 - Age of First Use: Teenage 1 - Amount (size/oz): 6 pack on weekdays, 2 18 pack of beer on weekends 1 - Frequency: daily 1 - Duration: 6 months                  Sleep: Poor  Appetite:  Poor  Current Medications:  Current Facility-Administered Medications  Medication Dose Route Frequency Provider Last Rate Last Admin   acetaminophen (TYLENOL) tablet 650 mg  650 mg Oral Q6H PRN Rankin, Shuvon B, NP       alum & mag hydroxide-simeth (MAALOX/MYLANTA) 200-200-20  MG/5ML suspension 30 mL  30 mL Oral Q4H PRN Rankin, Shuvon B, NP       hydrOXYzine (ATARAX/VISTARIL) tablet 25 mg  25 mg Oral Q6H PRN Rankin, Shuvon B, NP       loperamide (IMODIUM) capsule 2-4 mg  2-4 mg Oral PRN Rankin, Shuvon B, NP       LORazepam (ATIVAN) tablet 1 mg  1 mg Oral Q6H PRN Rankin, Shuvon B, NP       LORazepam (ATIVAN) tablet 1 mg  1 mg Oral QID Rankin, Shuvon B, NP   1 mg at 11/14/20 1017   Followed by   Melene Muller ON 11/15/2020] LORazepam (ATIVAN) tablet 1 mg  1 mg Oral TID Rankin, Shuvon B, NP       Followed by   Melene Muller ON 11/16/2020] LORazepam (ATIVAN) tablet 1 mg  1 mg Oral BID Rankin, Shuvon B, NP       Followed by   Melene Muller ON 11/17/2020] LORazepam (ATIVAN) tablet 1 mg  1 mg Oral Daily Rankin, Shuvon B, NP       magnesium hydroxide (MILK OF MAGNESIA) suspension 30 mL  30 mL Oral Daily PRN Rankin, Shuvon B, NP       multivitamin with minerals tablet 1 tablet  1 tablet Oral Daily Rankin, Shuvon B, NP   1 tablet at 11/14/20 1017   nicotine (NICODERM CQ - dosed in mg/24 hours) patch 21 mg  21 mg Transdermal Daily Rankin, Shuvon B, NP   21 mg at 11/14/20 1016   ondansetron (ZOFRAN-ODT) disintegrating tablet 4 mg  4 mg Oral Q6H PRN Rankin, Shuvon B, NP       thiamine tablet 100 mg  100 mg Oral Daily Rankin, Shuvon B, NP   100 mg at 11/14/20 1017   traZODone (DESYREL) tablet 50 mg  50 mg Oral QHS PRN Rankin, Shuvon B, NP       No current outpatient medications on file.    Labs  Lab Results:  Admission on 11/13/2020  Component Date Value Ref Range Status   SARS Coronavirus 2 by RT PCR 11/13/2020 NEGATIVE  NEGATIVE Final   Comment: (NOTE) SARS-CoV-2 target nucleic acids are NOT DETECTED.  The SARS-CoV-2 RNA is generally detectable in upper respiratory specimens during the acute phase of infection. The lowest concentration of SARS-CoV-2 viral copies this assay can detect is 138 copies/mL. A negative result does not preclude SARS-Cov-2 infection and should not be used as the  sole basis for treatment or other patient management decisions. A negative result may occur with  improper specimen collection/handling, submission of specimen other  than nasopharyngeal swab, presence of viral mutation(s) within the areas targeted by this assay, and inadequate number of viral copies(<138 copies/mL). A negative result must be combined with clinical observations, patient history, and epidemiological information. The expected result is Negative.  Fact Sheet for Patients:  BloggerCourse.com  Fact Sheet for Healthcare Providers:  SeriousBroker.it  This test is no                          t yet approved or cleared by the [place] FDA and  has been authorized for detection and/or diagnosis of SARS-CoV-2 by FDA under an Emergency Use Authorization (EUA). This EUA will remain  in effect (meaning this test can be used) for the duration of the COVID-19 declaration under Section 564(b)(1) of the Act, 21 U.S.C.section 360bbb-3(b)(1), unless the authorization is terminated  or revoked sooner.       Influenza A by PCR 11/13/2020 NEGATIVE  NEGATIVE Final   Influenza B by PCR 11/13/2020 NEGATIVE  NEGATIVE Final   Comment: (NOTE) The Xpert Xpress SARS-CoV-2/FLU/RSV plus assay is intended as an aid in the diagnosis of influenza from Nasopharyngeal swab specimens and should not be used as a sole basis for treatment. Nasal washings and aspirates are unacceptable for Xpert Xpress SARS-CoV-2/FLU/RSV testing.  Fact Sheet for Patients: BloggerCourse.com  Fact Sheet for Healthcare Providers: SeriousBroker.it  This test is not yet approved or cleared by the [place] FDA and has been authorized for detection and/or diagnosis of SARS-CoV-2 by FDA under an Emergency Use Authorization (EUA). This EUA will remain in effect (meaning this test can be used) for the duration of  the COVID-19 declaration under Section 564(b)(1) of the Act, 21 U.S.C. section 360bbb-3(b)(1), unless the authorization is terminated or revoked.  Performed at [facility], [address], [place], [place] [address]    SARS Coronavirus 2 Ag 11/13/2020 Negative  Negative Final   WBC 11/13/2020 8.0  4.0 - 10.5 K/uL Final   RBC 11/13/2020 4.83  4.22 - 5.81 MIL/uL Final   Hemoglobin 11/13/2020 16.0  13.0 - 17.0 g/dL Final   HCT 93/81/0175 46.7  39.0 - 52.0 % Final   MCV 11/13/2020 96.7  80.0 - 100.0 fL Final   MCH 11/13/2020 33.1  26.0 - 34.0 pg Final   MCHC 11/13/2020 34.3  30.0 - 36.0 g/dL Final   RDW 10/31/8525 12.7  11.5 - 15.5 % Final   Platelets 11/13/2020 207  150 - 400 K/uL Final   nRBC 11/13/2020 0.0  0.0 - 0.2 % Final   Neutrophils Relative % 11/13/2020 63  % Final   Neutro Abs 11/13/2020 5.0  1.7 - 7.7 K/uL Final   Lymphocytes Relative 11/13/2020 29  % Final   Lymphs Abs 11/13/2020 2.4  0.7 - 4.0 K/uL Final   Monocytes Relative 11/13/2020 6  % Final   Monocytes Absolute 11/13/2020 0.4  0.1 - 1.0 K/uL Final   Eosinophils Relative 11/13/2020 2  % Final   Eosinophils Absolute 11/13/2020 0.2  0.0 - 0.5 K/uL Final   Basophils Relative 11/13/2020 0  % Final   Basophils Absolute 11/13/2020 0.0  0.0 - 0.1 K/uL Final   Immature Granulocytes 11/13/2020 0  % Final   Abs Immature Granulocytes 11/13/2020 0.03  0.00 - 0.07 K/uL Final   Performed at [facility], [address], [place], [place] [address]   Sodium 11/13/2020 141  135 - 145 mmol/L Final   Potassium 11/13/2020  4.1  3.5 - 5.1 mmol/L Final   Chloride 11/13/2020 106  98 - 111 mmol/L Final   CO2 11/13/2020 26  22 - 32 mmol/L Final   Glucose, Bld 11/13/2020 88  70 - 99 mg/dL Final   Glucose reference range applies only to samples taken after fasting for at least 8 hours.   BUN 11/13/2020 9  6 - 20 mg/dL Final   Creatinine, Ser 11/13/2020 0.87  0.61 - 1.24 mg/dL Final   Calcium 16/10/9602 9.1  8.9 - 10.3 mg/dL  Final   Total Protein 11/13/2020 6.7  6.5 - 8.1 g/dL Final   Albumin 54/09/8117 4.0  3.5 - 5.0 g/dL Final   AST 14/78/2956 22  15 - 41 U/L Final   ALT 11/13/2020 15  0 - 44 U/L Final   Alkaline Phosphatase 11/13/2020 63  38 - 126 U/L Final   Total Bilirubin 11/13/2020 0.4  0.3 - 1.2 mg/dL Final   GFR, Estimated 11/13/2020 >60  >60 mL/min Final   Comment: (NOTE) Calculated using the CKD-EPI Creatinine Equation (2021)    Anion gap 11/13/2020 9  5 - 15 Final   Performed at [facility], [address], [place], [place] [address]   Hgb A1c MFr Bld 11/13/2020 5.4  4.8 - 5.6 % Final   Comment: (NOTE) Pre diabetes:          5.7%-6.4%  Diabetes:              >6.4%  Glycemic control for   <7.0% adults with diabetes    Mean Plasma Glucose 11/13/2020 108.28  mg/dL Final   Performed at [facility], [address], [place], [place] [address]   Magnesium 11/13/2020 2.4  1.7 - 2.4 mg/dL Final   Performed at [facility], [address], [place], [place] [address]   Alcohol, Ethyl (B) 11/13/2020 13 (A)  <10 mg/dL Final   Comment: (NOTE) Lowest detectable limit for serum alcohol is 10 mg/dL.  For medical purposes only. Performed at [facility], [address] [place], [place] [address]    Cholesterol 11/13/2020 195  0 - 200 mg/dL Final   Triglycerides 32/44/0102 129  <150 mg/dL Final   HDL 72/53/6644 89  >40 mg/dL Final   Total CHOL/HDL Ratio 11/13/2020 2.2  RATIO Final   VLDL 11/13/2020 26  0 - 40 mg/dL Final   LDL Cholesterol 11/13/2020 80  0 - 99 mg/dL Final   Comment:        Total Cholesterol/HDL:CHD Risk Coronary Heart Disease Risk Table                     Men   Women  1/2 Average Risk   3.4   3.3  Average Risk       5.0   4.4  2 X Average Risk   9.6   7.1  3 X Average Risk  23.4   11.0        Use the calculated Patient Ratio above and the CHD Risk Table to determine the patient's CHD Risk.        ATP III CLASSIFICATION (LDL):  <100     mg/dL    Optimal  034-742  mg/dL   Near or Above                    Optimal  130-159  mg/dL   Borderline  595-638  mg/dL   High  >  190     mg/dL   Very High Performed at [facility], [address], [place], [place] [address]    TSH 11/13/2020 0.771  0.350 - 4.500 uIU/mL Final   Comment: Performed by a 3rd Generation assay with a functional sensitivity of <=0.01 uIU/mL. Performed at [facility], [address], [place], [place] [address]    Color, Urine 11/13/2020 YELLOW  YELLOW Final   APPearance 11/13/2020 CLEAR  CLEAR Final   Specific Gravity, Urine 11/13/2020 1.017  1.005 - 1.030 Final   pH 11/13/2020 5.0  5.0 - 8.0 Final   Glucose, UA 11/13/2020 NEGATIVE  NEGATIVE mg/dL Final   Hgb urine dipstick 11/13/2020 NEGATIVE  NEGATIVE Final   Bilirubin Urine 11/13/2020 NEGATIVE  NEGATIVE Final   Ketones, ur 11/13/2020 NEGATIVE  NEGATIVE mg/dL Final   Protein, ur 43/15/4008 NEGATIVE  NEGATIVE mg/dL Final   Nitrite 67/61/9509 NEGATIVE  NEGATIVE Final   Leukocytes,Ua 11/13/2020 NEGATIVE  NEGATIVE Final   Performed at [facility], [address] [place], [place] [address]   POC Amphetamine UR 11/13/2020 None Detected  NONE DETECTED (Cut Off Level 1000 ng/mL) Final   POC Secobarbital (BAR) 11/13/2020 None Detected  NONE DETECTED (Cut Off Level 300 ng/mL) Final   POC Buprenorphine (BUP) 11/13/2020 None Detected  NONE DETECTED (Cut Off Level 10 ng/mL) Final   POC Oxazepam (BZO) 11/13/2020 None Detected  NONE DETECTED (Cut Off Level 300 ng/mL) Final   POC Cocaine UR 11/13/2020 None Detected  NONE DETECTED (Cut Off Level 300 ng/mL) Final   POC Methamphetamine UR 11/13/2020 None Detected  NONE DETECTED (Cut Off Level 1000 ng/mL) Final   POC Morphine 11/13/2020 None Detected  NONE DETECTED (Cut Off Level 300 ng/mL) Final   POC Oxycodone UR 11/13/2020 None Detected  NONE DETECTED (Cut Off Level 100 ng/mL) Final   POC Methadone UR 11/13/2020 None Detected  NONE DETECTED (Cut Off Level 300  ng/mL) Final   POC Marijuana UR 11/13/2020 None Detected  NONE DETECTED (Cut Off Level 50 ng/mL) Final   SARSCOV2ONAVIRUS 2 AG 11/13/2020 NEGATIVE  NEGATIVE Final   Comment: (NOTE) SARS-CoV-2 antigen NOT DETECTED.   Negative results are presumptive.  Negative results do not preclude SARS-CoV-2 infection and should not be used as the sole basis for treatment or other patient management decisions, including infection  control decisions, particularly in the presence of clinical signs and  symptoms consistent with COVID-19, or in those who have been in contact with the virus.  Negative results must be combined with clinical observations, patient history, and epidemiological information. The expected result is Negative.  Fact Sheet for Patients: https://www.jennings-kim.com/  Fact Sheet for Healthcare Providers: https://alexander-rogers.biz/  This test is not yet approved or cleared by the [place] FDA and  has been authorized for detection and/or diagnosis of SARS-CoV-2 by FDA under an Emergency Use Authorization (EUA).  This EUA will remain in effect (meaning this test can be used) for the duration of  the COV                          ID-19 declaration under Section 564(b)(1) of the Act, 21 U.S.C. section 360bbb-3(b)(1), unless the authorization is terminated or revoked sooner.      Blood Alcohol level:  Lab Results  Component Value Date   ETH 13 (H) 11/13/2020    Metabolic Disorder Labs: Lab Results  Component Value Date   HGBA1C 5.4 11/13/2020  MPG 108.28 11/13/2020   No results found for: PROLACTIN Lab Results  Component Value Date   CHOL 195 11/13/2020   TRIG 129 11/13/2020   HDL 89 11/13/2020   CHOLHDL 2.2 11/13/2020   VLDL 26 11/13/2020   LDLCALC 80 11/13/2020    Therapeutic Lab Levels: No results found for: LITHIUM No results found for: VALPROATE No components found for:  CBMZ  Physical Findings   Flowsheet Row ED from  11/13/2020 in [facility]  C-SSRS RISK CATEGORY Moderate Risk        Musculoskeletal  Strength & Muscle Tone: within normal limits Gait & Station: normal Patient leans: N/A  Psychiatric Specialty Exam  Presentation  General Appearance: Appropriate for Environment; Casual  Eye Contact:Good  Speech:Clear and Coherent; Normal Rate  Speech Volume:Normal  Handedness:Right   Mood and Affect  Mood:Dysphoric  Affect:Appropriate; Congruent   Thought Process  Thought Processes:Coherent; Goal Directed; Linear  Descriptions of Associations:Intact  Orientation:Full (Time, Place and Person)  Thought Content:WDL; Logical  Diagnosis of Schizophrenia or Schizoaffective disorder in past: No    Hallucinations:Hallucinations: None  Ideas of Reference:None  Suicidal Thoughts:Suicidal Thoughts: No SI Passive Intent and/or Plan: Without Intent; With Plan (Has thought about walking in front of train)  Homicidal Thoughts:Homicidal Thoughts: No   Sensorium  Memory:Immediate Good; Recent Good; Remote Good  Judgment:Fair  Insight:Fair   Executive Functions  Concentration:Good  Attention Span:Good  Recall:Good  Fund of Knowledge:Good  Language:Good   Psychomotor Activity  Psychomotor Activity:Psychomotor Activity: Normal   Assets  Assets:Communication Skills; Desire for Improvement; Resilience; Housing; Vocational/Educational; Social Support   Sleep  Sleep:Sleep: Poor   Nutritional Assessment (For OBS and FBC admissions only) Has the patient had a weight loss or gain of 10 pounds or more in the last 3 months?: No Has the patient had a decrease in food intake/or appetite?: No Does the patient have dental problems?: No Does the patient have eating habits or behaviors that may be indicators of an eating disorder including binging or inducing vomiting?: No Has the patient recently lost weight without trying?: 0 Has the patient been  eating poorly because of a decreased appetite?: 0 Malnutrition Screening Tool Score: 0    Physical Exam  Physical Exam Constitutional:      Appearance: Normal appearance. He is normal weight.  HENT:     Head: Normocephalic and atraumatic.  Eyes:     Extraocular Movements: Extraocular movements intact.  Cardiovascular:     Rate and Rhythm: Normal rate and regular rhythm.     Heart sounds: Normal heart sounds.  Pulmonary:     Effort: Pulmonary effort is normal.     Breath sounds: Normal breath sounds.  Abdominal:     General: Abdomen is flat. Bowel sounds are normal.     Palpations: Abdomen is soft.  Neurological:     General: No focal deficit present.     Mental Status: He is alert and oriented to person, place, and time.  Psychiatric:        Attention and Perception: Attention and perception normal.        Speech: Speech normal.        Behavior: Behavior normal. Behavior is cooperative.        Thought Content: Thought content normal.   Review of Systems  Constitutional:  Negative for chills and fever.  HENT:  Negative for hearing loss.   Eyes:  Negative for discharge and redness.  Respiratory:  Negative for cough.  Cardiovascular:  Negative for chest pain.  Gastrointestinal:  Negative for abdominal pain.  Musculoskeletal:  Negative for myalgias.  Neurological:  Negative for headaches.  Psychiatric/Behavioral:  Positive for depression and substance abuse. Negative for hallucinations and suicidal ideas. The patient is nervous/anxious and has insomnia.   Blood pressure 127/88, pulse 69, temperature 97.8 F (36.6 C), temperature source Tympanic, resp. rate 16, SpO2 97 %. There is no height or weight on file to calculate BMI.  Treatment Plan Summary: 40 year old male with history of alcohol abuse, depression, anxiety and self-reported bipolar disorder who presented to the [facility] on 11/7 with passive suicidal ideations and desire for alcohol treatment.  Patient was admitted to  the [facility] for alcohol detox and further treatment.  He was started on Ativan taper and alcohol withdrawal protocol.  Most recent CIWA was 0; patient has not received any as needed Ativan for elevated CIWA scores.  Today patient describes experiencing anxiety and depression and desire to start medication.  Patient reported a history of bipolar disorder as a diagnosis; however, by information provided by patient it does not appear that he has ever had a true manic or hypomanic episode in the context of sobriety.  Patient states that he received this diagnosis recently (within the last couple years) in the context of using substances.  Patient is amenable to a trial of Remeron 15 mg nightly to assist with anxiety, depression and sleep.  AUD -patient reports daily drinking for the last 6 months and was started on alcohol detox protocol- ativan 1 mg QID, 1 mg tID, 1 mg BID, 1 mg -CIWA protocol -patient currently does not express interested in substance use follow up-will continue to assess while at the [facility]  Substance induced mood disorder vs unspecified mood disorder vs mdd -start remoern 15 mg qhs for mood, anxiety, insomnia. -monitor depression ssx/and assess for AE/SE of medications  History of blood clot in lower extremity -Patient reports a history of a blood clot in his leg and states that he was instructed to take aspirin daily. -start ASA 81 mg daily  Dispo:ongoing. SW assisting. Likely back to residence. Ativan taper is scheduled to end Friday-likely discharge Friday after finishing taper.   Estella Husk, MD 11/14/2020 12:33 PM

## 2020-11-14 NOTE — Progress Notes (Signed)
Patient has been calm and cooperative without complaint or distress.  His CIWA remains at 0.  He is tolerating the ativan taper.  Presently he is resting in bed.  Will continue to monitor and provide support as needed.

## 2020-11-14 NOTE — Clinical Social Work Psych Note (Addendum)
CSW Initial Note   CSW met with Tom Davenport for introduction and to begin discussions regarding treatment and potential discharge planning.  Tom Davenport shared that he came to the [facility] seeking help for his ongoing ETOH abuse. He also shared that he needed assistance for ongoing depressive symptoms. Tom Davenport shared with CSW that he was diagnosed with Bipolar disorder 1 year ago and was prescribed various medications. Tom Davenport reports that he does not believe the medications were effective, and instead of "stabilizing" his symptoms, he experienced "zombie-like" energy and mood swings.   Tom Davenport reports that he drinks a 6pk of beer on a daily basis.   Tom Davenport reports he lives with his nephew and his nephew's grandmother in [place], [place]. He also reports being currently employed by [employer].  Tom Davenport reports he just wants "normalcy" and wants to stop drinking ETOH again. Tom Davenport reports he is not interested in residential treatment services at this time due to "bills and his job".   Tom Davenport asked for time to make a final decision on whether or not he wanted to go to residential treatment services. If he does not go to treatment, Tom Davenport plans to return home with his nephew.    CSW will continue to follow.     Radonna Ricker, MSW, LCSW Clinical Education officer, museum ([place]) [facility]

## 2020-11-14 NOTE — ED Notes (Signed)
Pt is currently sleeping, no distress noted, environmental check complete, will continue to monitor patient for safety. ? ?

## 2020-11-14 NOTE — ED Notes (Signed)
Patient is  awake and alert on unit.  He is presently in day room drinking coffee and eating breakfast.  He does not exhibit signs or symptoms of ETOH withdrawal and vss at 120/78 72 97.7 18 100% R/A.  He remains disheveled with poor eye contact and depressed mood.  Will continue to monitor and provide safe environment.  Will encourage him to express thoughts and feelings if overwhelmed.

## 2020-11-14 NOTE — ED Notes (Signed)
Pt came to this nurse and requested medication for sleep.

## 2020-11-15 DIAGNOSIS — R45851 Suicidal ideations: Secondary | ICD-10-CM | POA: Diagnosis not present

## 2020-11-15 DIAGNOSIS — F1020 Alcohol dependence, uncomplicated: Secondary | ICD-10-CM | POA: Diagnosis not present

## 2020-11-15 DIAGNOSIS — Z20822 Contact with and (suspected) exposure to covid-19: Secondary | ICD-10-CM | POA: Diagnosis not present

## 2020-11-15 DIAGNOSIS — F1024 Alcohol dependence with alcohol-induced mood disorder: Secondary | ICD-10-CM | POA: Diagnosis not present

## 2020-11-15 LAB — RESP PANEL BY RT-PCR (FLU A&B, COVID) ARPGX2
Influenza A by PCR: NEGATIVE
Influenza B by PCR: NEGATIVE
SARS Coronavirus 2 by RT PCR: NEGATIVE

## 2020-11-15 NOTE — ED Notes (Signed)
Pt resting in bed. A&O x4, calm and cooperative. Pt denies current SI/HI/AVH. Pt denies any current needs. No signs of acute distress noted. Will continue to monitor for safety.  

## 2020-11-15 NOTE — ED Provider Notes (Signed)
Behavioral Health Progress Note  Date and Time: 11/15/2020 11:19 AM Name: Tom Davenport MRN:  [id number]  Subjective:   40 year old male with history of alcohol abuse, depression, anxiety and self-reported bipolar disorder who presented to the [facility] on 11/7 with passive suicidal ideations and desire for alcohol treatment.  Patient was admitted to the [facility] for alcohol detox and further treatment.  He was started on Ativan taper and alcohol withdrawal protocol.  Patient seen and chart reviewed-patient has been medication compliant and has not needed any additional Ativan for alcohol withdrawal symptoms.  Most recent CIWA was 0.    Patient interviewed in his room this morning, he is calm, cooperative and pleasant.  Patient describes his mood as "alright".  Patient indicates that he slept "like a rock" but states he continues to feel tired this morning.  .  Patient denies issues with appetite and states he has been eating well.  Patient denies alcohol withdrawal symptoms of tremor, GI upset, nausea, vomiting, anxiety, diaphoresis, palpitations.  Patient's only current complaints are  constipation and  low back pain which he attributes to the bed.  Informed patient that he has as needed medications ordered to assist with these symptoms and advised patient to ask nursing staff for medications if he felt that he needed them-patient verbalized understanding.  Discussed with patient that Ativan taper he was started on scheduled and Friday morning and encouraged patient to remain in the [facility] until completion-patient is agreeable to this plan.  Patient denies SI/HI/AVH.  Discussed options for outpatient follow-up regarding substance use treatment-patient indicates that he is not interested in residential treatment but may be interested in outpatient treatment.  Informed patient that the social worker will be fine to discuss with him in more detail-patient verbalized understanding.    Final diagnoses:  Alcohol use  disorder, severe, dependence (HCC)  Alcohol-induced mood disorder with depressive symptoms (HCC)  Passive suicidal ideations    Total Time spent with patient: 15 minutes   Past Medical History: History reviewed. No pertinent past medical history. History reviewed. No pertinent surgical history. Family History: History reviewed. No pertinent family history.  Social History:  Social History   Substance and Sexual Activity  Alcohol Use None     Social History   Substance and Sexual Activity  Drug Use Not on file    Social History   Socioeconomic History   Marital status: Single    Spouse name: Not on file   Number of children: Not on file   Years of education: Not on file   Highest education level: Not on file  Occupational History   Not on file  Tobacco Use   Smoking status: Not on file   Smokeless tobacco: Not on file  Substance and Sexual Activity   Alcohol use: Not on file   Drug use: Not on file   Sexual activity: Not on file  Other Topics Concern   Not on file  Social History Narrative   Not on file   Social Determinants of Health   Financial Resource Strain: Not on file  Food Insecurity: Not on file  Transportation Needs: Not on file  Physical Activity: Not on file  Stress: Not on file  Social Connections: Not on file   SDOH:  SDOH Screenings   Alcohol Screen: Not on file  Depression (CWC3-7): Not on file  Financial Resource Strain: Not on file  Food Insecurity: Not on file  Housing: Not on file  Physical Activity: Not on file  Social Connections: Not on file  Stress: Not on file  Tobacco Use: Not on file  Transportation Needs: Not on file   Additional Social History:    Pain Medications: See MAR Prescriptions: See MAR Over the Counter: See MAR History of alcohol / drug use?: Yes Longest period of sobriety (when/how long): 1 year Name of Substance 1: ETOH 1 - Age of First Use: Teenage 1 - Amount (size/oz): 6 pack on weekdays, 2 18 pack  of beer on weekends 1 - Frequency: daily 1 - Duration: 6 months                  Sleep: Fair  Appetite:  Fair  Current Medications:  Current Facility-Administered Medications  Medication Dose Route Frequency Provider Last Rate Last Admin   acetaminophen (TYLENOL) tablet 650 mg  650 mg Oral Q6H PRN Rankin, Shuvon B, NP       alum & mag hydroxide-simeth (MAALOX/MYLANTA) 200-200-20 MG/5ML suspension 30 mL  30 mL Oral Q4H PRN Rankin, Shuvon B, NP       aspirin EC tablet 81 mg  81 mg Oral Daily Estella Husk, MD   81 mg at 11/15/20 0947   hydrOXYzine (ATARAX/VISTARIL) tablet 25 mg  25 mg Oral Q6H PRN Rankin, Shuvon B, NP       loperamide (IMODIUM) capsule 2-4 mg  2-4 mg Oral PRN Rankin, Shuvon B, NP       LORazepam (ATIVAN) tablet 1 mg  1 mg Oral Q6H PRN Rankin, Shuvon B, NP       LORazepam (ATIVAN) tablet 1 mg  1 mg Oral TID Rankin, Shuvon B, NP   1 mg at 11/15/20 0947   Followed by   Melene Muller ON 11/16/2020] LORazepam (ATIVAN) tablet 1 mg  1 mg Oral BID Rankin, Shuvon B, NP       Followed by   Melene Muller ON 11/17/2020] LORazepam (ATIVAN) tablet 1 mg  1 mg Oral Daily Rankin, Shuvon B, NP       magnesium hydroxide (MILK OF MAGNESIA) suspension 30 mL  30 mL Oral Daily PRN Rankin, Shuvon B, NP       mirtazapine (REMERON) tablet 15 mg  15 mg Oral QHS Estella Husk, MD   15 mg at 11/14/20 2114   multivitamin with minerals tablet 1 tablet  1 tablet Oral Daily Rankin, Shuvon B, NP   1 tablet at 11/15/20 0948   nicotine (NICODERM CQ - dosed in mg/24 hours) patch 21 mg  21 mg Transdermal Daily Rankin, Shuvon B, NP   21 mg at 11/15/20 0948   ondansetron (ZOFRAN-ODT) disintegrating tablet 4 mg  4 mg Oral Q6H PRN Rankin, Shuvon B, NP       thiamine tablet 100 mg  100 mg Oral Daily Rankin, Shuvon B, NP   100 mg at 11/15/20 0948   traZODone (DESYREL) tablet 50 mg  50 mg Oral QHS PRN Rankin, Shuvon B, NP   50 mg at 11/14/20 2114   No current outpatient medications on file.    Labs  Lab  Results:  Admission on 11/13/2020  Component Date Value Ref Range Status   SARS Coronavirus 2 by RT PCR 11/13/2020 NEGATIVE  NEGATIVE Final   Comment: (NOTE) SARS-CoV-2 target nucleic acids are NOT DETECTED.  The SARS-CoV-2 RNA is generally detectable in upper respiratory specimens during the acute phase of infection. The lowest concentration of SARS-CoV-2 viral copies this assay can detect is 138 copies/mL. A negative result does not preclude SARS-Cov-2  infection and should not be used as the sole basis for treatment or other patient management decisions. A negative result may occur with  improper specimen collection/handling, submission of specimen other than nasopharyngeal swab, presence of viral mutation(s) within the areas targeted by this assay, and inadequate number of viral copies(<138 copies/mL). A negative result must be combined with clinical observations, patient history, and epidemiological information. The expected result is Negative.  Fact Sheet for Patients:  BloggerCourse.com  Fact Sheet for Healthcare Providers:  SeriousBroker.it  This test is no                          t yet approved or cleared by the [place] FDA and  has been authorized for detection and/or diagnosis of SARS-CoV-2 by FDA under an Emergency Use Authorization (EUA). This EUA will remain  in effect (meaning this test can be used) for the duration of the COVID-19 declaration under Section 564(b)(1) of the Act, 21 U.S.C.section 360bbb-3(b)(1), unless the authorization is terminated  or revoked sooner.       Influenza A by PCR 11/13/2020 NEGATIVE  NEGATIVE Final   Influenza B by PCR 11/13/2020 NEGATIVE  NEGATIVE Final   Comment: (NOTE) The Xpert Xpress SARS-CoV-2/FLU/RSV plus assay is intended as an aid in the diagnosis of influenza from Nasopharyngeal swab specimens and should not be used as a sole basis for treatment. Nasal washings  and aspirates are unacceptable for Xpert Xpress SARS-CoV-2/FLU/RSV testing.  Fact Sheet for Patients: BloggerCourse.com  Fact Sheet for Healthcare Providers: SeriousBroker.it  This test is not yet approved or cleared by the [place] FDA and has been authorized for detection and/or diagnosis of SARS-CoV-2 by FDA under an Emergency Use Authorization (EUA). This EUA will remain in effect (meaning this test can be used) for the duration of the COVID-19 declaration under Section 564(b)(1) of the Act, 21 U.S.C. section 360bbb-3(b)(1), unless the authorization is terminated or revoked.  Performed at [facility], [address], [place], [place] [address]    SARS Coronavirus 2 Ag 11/13/2020 Negative  Negative Final   WBC 11/13/2020 8.0  4.0 - 10.5 K/uL Final   RBC 11/13/2020 4.83  4.22 - 5.81 MIL/uL Final   Hemoglobin 11/13/2020 16.0  13.0 - 17.0 g/dL Final   HCT 19/14/7829 46.7  39.0 - 52.0 % Final   MCV 11/13/2020 96.7  80.0 - 100.0 fL Final   MCH 11/13/2020 33.1  26.0 - 34.0 pg Final   MCHC 11/13/2020 34.3  30.0 - 36.0 g/dL Final   RDW 56/21/3086 12.7  11.5 - 15.5 % Final   Platelets 11/13/2020 207  150 - 400 K/uL Final   nRBC 11/13/2020 0.0  0.0 - 0.2 % Final   Neutrophils Relative % 11/13/2020 63  % Final   Neutro Abs 11/13/2020 5.0  1.7 - 7.7 K/uL Final   Lymphocytes Relative 11/13/2020 29  % Final   Lymphs Abs 11/13/2020 2.4  0.7 - 4.0 K/uL Final   Monocytes Relative 11/13/2020 6  % Final   Monocytes Absolute 11/13/2020 0.4  0.1 - 1.0 K/uL Final   Eosinophils Relative 11/13/2020 2  % Final   Eosinophils Absolute 11/13/2020 0.2  0.0 - 0.5 K/uL Final   Basophils Relative 11/13/2020 0  % Final   Basophils Absolute 11/13/2020 0.0  0.0 - 0.1 K/uL Final   Immature Granulocytes 11/13/2020 0  % Final   Abs Immature Granulocytes 11/13/2020 0.03  0.00 - 0.07 K/uL  Final   Performed at [facility], [address], [place], [place] [address]   Sodium 11/13/2020 141  135 - 145 mmol/L Final   Potassium 11/13/2020 4.1  3.5 - 5.1 mmol/L Final   Chloride 11/13/2020 106  98 - 111 mmol/L Final   CO2 11/13/2020 26  22 - 32 mmol/L Final   Glucose, Bld 11/13/2020 88  70 - 99 mg/dL Final   Glucose reference range applies only to samples taken after fasting for at least 8 hours.   BUN 11/13/2020 9  6 - 20 mg/dL Final   Creatinine, Ser 11/13/2020 0.87  0.61 - 1.24 mg/dL Final   Calcium 60/45/4098 9.1  8.9 - 10.3 mg/dL Final   Total Protein 11/91/4782 6.7  6.5 - 8.1 g/dL Final   Albumin 95/62/1308 4.0  3.5 - 5.0 g/dL Final   AST 65/78/4696 22  15 - 41 U/L Final   ALT 11/13/2020 15  0 - 44 U/L Final   Alkaline Phosphatase 11/13/2020 63  38 - 126 U/L Final   Total Bilirubin 11/13/2020 0.4  0.3 - 1.2 mg/dL Final   GFR, Estimated 11/13/2020 >60  >60 mL/min Final   Comment: (NOTE) Calculated using the CKD-EPI Creatinine Equation (2021)    Anion gap 11/13/2020 9  5 - 15 Final   Performed at [facility], [address] [place], [place] [address]   Hgb A1c MFr Bld 11/13/2020 5.4  4.8 - 5.6 % Final   Comment: (NOTE) Pre diabetes:          5.7%-6.4%  Diabetes:              >6.4%  Glycemic control for   <7.0% adults with diabetes    Mean Plasma Glucose 11/13/2020 108.28  mg/dL Final   Performed at [facility], [address] [place], [place] [address]   Magnesium 11/13/2020 2.4  1.7 - 2.4 mg/dL Final   Performed at [facility], [address], [place], [place] [address]   Alcohol, Ethyl (B) 11/13/2020 13 (A)  <10 mg/dL Final   Comment: (NOTE) Lowest detectable limit for serum alcohol is 10 mg/dL.  For medical purposes only. Performed at [facility], [address], [place], [place] [address]    Cholesterol 11/13/2020 195  0 - 200 mg/dL Final   Triglycerides 64/40/3474 129  <150 mg/dL Final   HDL 25/95/6387 89  >40 mg/dL Final   Total CHOL/HDL Ratio 11/13/2020 2.2  RATIO Final    VLDL 11/13/2020 26  0 - 40 mg/dL Final   LDL Cholesterol 11/13/2020 80  0 - 99 mg/dL Final   Comment:        Total Cholesterol/HDL:CHD Risk Coronary Heart Disease Risk Table                     Men   Women  1/2 Average Risk   3.4   3.3  Average Risk       5.0   4.4  2 X Average Risk   9.6   7.1  3 X Average Risk  23.4   11.0        Use the calculated Patient Ratio above and the CHD Risk Table to determine the patient's CHD Risk.        ATP III CLASSIFICATION (LDL):  <100     mg/dL   Optimal  564-332  mg/dL   Near or Above  Optimal  130-159  mg/dL   Borderline  956-387  mg/dL   High  >564     mg/dL   Very High Performed at [facility], [address] [place], [place] [address]    TSH 11/13/2020 0.771  0.350 - 4.500 uIU/mL Final   Comment: Performed by a 3rd Generation assay with a functional sensitivity of <=0.01 uIU/mL. Performed at [facility], [address] [place], [place] [address]    Color, Urine 11/13/2020 YELLOW  YELLOW Final   APPearance 11/13/2020 CLEAR  CLEAR Final   Specific Gravity, Urine 11/13/2020 1.017  1.005 - 1.030 Final   pH 11/13/2020 5.0  5.0 - 8.0 Final   Glucose, UA 11/13/2020 NEGATIVE  NEGATIVE mg/dL Final   Hgb urine dipstick 11/13/2020 NEGATIVE  NEGATIVE Final   Bilirubin Urine 11/13/2020 NEGATIVE  NEGATIVE Final   Ketones, ur 11/13/2020 NEGATIVE  NEGATIVE mg/dL Final   Protein, ur 66/06/3014 NEGATIVE  NEGATIVE mg/dL Final   Nitrite 01/15/3233 NEGATIVE  NEGATIVE Final   Leukocytes,Ua 11/13/2020 NEGATIVE  NEGATIVE Final   Performed at [facility], [address] [place], [place] [address]   POC Amphetamine UR 11/13/2020 None Detected  NONE DETECTED (Cut Off Level 1000 ng/mL) Final   POC Secobarbital (BAR) 11/13/2020 None Detected  NONE DETECTED (Cut Off Level 300 ng/mL) Final   POC Buprenorphine (BUP) 11/13/2020 None Detected  NONE DETECTED (Cut Off Level 10 ng/mL) Final   POC Oxazepam (BZO) 11/13/2020  None Detected  NONE DETECTED (Cut Off Level 300 ng/mL) Final   POC Cocaine UR 11/13/2020 None Detected  NONE DETECTED (Cut Off Level 300 ng/mL) Final   POC Methamphetamine UR 11/13/2020 None Detected  NONE DETECTED (Cut Off Level 1000 ng/mL) Final   POC Morphine 11/13/2020 None Detected  NONE DETECTED (Cut Off Level 300 ng/mL) Final   POC Oxycodone UR 11/13/2020 None Detected  NONE DETECTED (Cut Off Level 100 ng/mL) Final   POC Methadone UR 11/13/2020 None Detected  NONE DETECTED (Cut Off Level 300 ng/mL) Final   POC Marijuana UR 11/13/2020 None Detected  NONE DETECTED (Cut Off Level 50 ng/mL) Final   SARSCOV2ONAVIRUS 2 AG 11/13/2020 NEGATIVE  NEGATIVE Final   Comment: (NOTE) SARS-CoV-2 antigen NOT DETECTED.   Negative results are presumptive.  Negative results do not preclude SARS-CoV-2 infection and should not be used as the sole basis for treatment or other patient management decisions, including infection  control decisions, particularly in the presence of clinical signs and  symptoms consistent with COVID-19, or in those who have been in contact with the virus.  Negative results must be combined with clinical observations, patient history, and epidemiological information. The expected result is Negative.  Fact Sheet for Patients: https://www.jennings-kim.com/  Fact Sheet for Healthcare Providers: https://alexander-rogers.biz/  This test is not yet approved or cleared by the [place] FDA and  has been authorized for detection and/or diagnosis of SARS-CoV-2 by FDA under an Emergency Use Authorization (EUA).  This EUA will remain in effect (meaning this test can be used) for the duration of  the COV                          ID-19 declaration under Section 564(b)(1) of the Act, 21 U.S.C. section 360bbb-3(b)(1), unless the authorization is terminated or revoked sooner.      Blood Alcohol level:  Lab Results  Component Value Date   ETH 13 (H)  11/13/2020  Metabolic Disorder Labs: Lab Results  Component Value Date   HGBA1C 5.4 11/13/2020   MPG 108.28 11/13/2020   No results found for: PROLACTIN Lab Results  Component Value Date   CHOL 195 11/13/2020   TRIG 129 11/13/2020   HDL 89 11/13/2020   CHOLHDL 2.2 11/13/2020   VLDL 26 11/13/2020   LDLCALC 80 11/13/2020    Therapeutic Lab Levels: No results found for: LITHIUM No results found for: VALPROATE No components found for:  CBMZ  Physical Findings   Flowsheet Row ED from 11/13/2020 in [facility]  C-SSRS RISK CATEGORY Moderate Risk        Musculoskeletal  Strength & Muscle Tone: within normal limits Gait & Station: normal Patient leans: N/A  Psychiatric Specialty Exam  Presentation  General Appearance: Appropriate for Environment; Casual  Eye Contact:Good  Speech:Clear and Coherent; Normal Rate  Speech Volume:Normal  Handedness:Right   Mood and Affect  Mood:Euthymic  Affect:Appropriate; Congruent   Thought Process  Thought Processes:Coherent; Goal Directed; Linear  Descriptions of Associations:Intact  Orientation:Full (Time, Place and Person)  Thought Content:WDL; Logical  Diagnosis of Schizophrenia or Schizoaffective disorder in past: No    Hallucinations:Hallucinations: None  Ideas of Reference:None  Suicidal Thoughts:Suicidal Thoughts: No  Homicidal Thoughts:Homicidal Thoughts: No   Sensorium  Memory:Immediate Good; Recent Good; Remote Good  Judgment:Fair  Insight:Fair   Executive Functions  Concentration:Good  Attention Span:Good  Recall:Good  Fund of Knowledge:Good  Language:Good   Psychomotor Activity  Psychomotor Activity:Psychomotor Activity: Normal   Assets  Assets:Communication Skills; Desire for Improvement; Vocational/Educational; Social Support   Sleep  Sleep:Sleep: Fair   No data recorded   Physical Exam  Physical Exam Constitutional:      Appearance:  Normal appearance. He is normal weight.  HENT:     Head: Normocephalic and atraumatic.  Eyes:     Extraocular Movements: Extraocular movements intact.  Pulmonary:     Effort: Pulmonary effort is normal.  Neurological:     General: No focal deficit present.     Mental Status: He is alert and oriented to person, place, and time.  Psychiatric:        Attention and Perception: Attention and perception normal.        Speech: Speech normal.        Behavior: Behavior normal. Behavior is cooperative.        Thought Content: Thought content normal.   Review of Systems  Constitutional:  Negative for chills and fever.  HENT:  Negative for hearing loss.   Eyes:  Negative for discharge and redness.  Respiratory:  Negative for cough.   Cardiovascular:  Negative for chest pain.  Gastrointestinal:  Negative for abdominal pain.  Musculoskeletal:  Positive for back pain. Negative for myalgias.       Low back pain  Neurological:  Negative for headaches.  Psychiatric/Behavioral:  Positive for substance abuse. Negative for hallucinations and suicidal ideas.   Blood pressure 122/79, pulse 67, temperature (!) 97.5 F (36.4 C), temperature source Tympanic, resp. rate 16, SpO2 99 %. There is no height or weight on file to calculate BMI.  Treatment Plan Summary: 40 year old male with history of alcohol abuse, depression, anxiety and self-reported bipolar disorder who presented to the [facility] on 11/7 with passive suicidal ideations and desire for alcohol treatment.  Patient was admitted to the [facility] for alcohol detox and further treatment.  He was started on Ativan taper and alcohol withdrawal protocol.  Most recent CIWA was 0; patient has not  received any as needed Ativan for elevated CIWA scores.  TPatient  AUD -patient reports daily drinking for the last 6 months and was started on alcohol detox protocol- ativan 1 mg QID, 1 mg tID, 1 mg BID, 1 mg -CIWA protocol -patient currently does not express interested  in substance use follow up-will continue to assess while at the [facility]-expressed possible interest in outpatient substance use treatment-SW assisting  Substance induced mood disorder vs unspecified mood disorder vs mdd -start remoern 15 mg qhs for mood, anxiety, insomnia. -monitor depression ssx/and assess for AE/SE of medications  History of blood clot in lower extremity -Patient reports a history of a blood clot in his leg and states that he was instructed to take aspirin daily. -start ASA 81 mg daily  Dispo:ongoing. SW assisting. Likely back to residence. Ativan taper is scheduled to end Friday-likely discharge Friday after finishing taper.   Estella Husk, MD 11/15/2020 11:19 AM

## 2020-11-15 NOTE — Progress Notes (Signed)
Patient is calm and pleasant.  Quiet and well related. Patient tolerating detox taper with CIWA of 0.  He ate dinner and rested on and off throughout the day.  He is now watching tv in dayroom and socializing with peers.  Denies avh shi or plan.  Will monitor and provide safe environment.

## 2020-11-15 NOTE — ED Notes (Signed)
Did not participate in group

## 2020-11-15 NOTE — ED Notes (Signed)
Pt is is bed sleeping. Respirations are even and unlabored. No acute distress noted. Will continue to monitor for safety

## 2020-11-15 NOTE — ED Notes (Signed)
Patient is presently asleep in bed without distress or complaint.  He is tolerating detox taper and CIWA is 0.  He denies avh shi or plan.  Will monitor and provide safe supportive environment.

## 2020-11-16 DIAGNOSIS — R45851 Suicidal ideations: Secondary | ICD-10-CM | POA: Diagnosis not present

## 2020-11-16 DIAGNOSIS — Z20822 Contact with and (suspected) exposure to covid-19: Secondary | ICD-10-CM | POA: Diagnosis not present

## 2020-11-16 DIAGNOSIS — F1020 Alcohol dependence, uncomplicated: Secondary | ICD-10-CM | POA: Diagnosis not present

## 2020-11-16 DIAGNOSIS — F1024 Alcohol dependence with alcohol-induced mood disorder: Secondary | ICD-10-CM | POA: Diagnosis not present

## 2020-11-16 MED ORDER — ASPIRIN 81 MG PO TBEC
81.0000 mg | DELAYED_RELEASE_TABLET | Freq: Every day | ORAL | 0 refills | Status: AC
Start: 2020-11-17 — End: 2020-12-17

## 2020-11-16 MED ORDER — MIRTAZAPINE 30 MG PO TABS: 30.0000 mg | ORAL_TABLET | Freq: Every day | ORAL | 0 refills | Status: AC

## 2020-11-16 MED ORDER — TRAZODONE HCL 50 MG PO TABS: 50.0000 mg | ORAL_TABLET | Freq: Every evening | ORAL | 0 refills | Status: AC | PRN

## 2020-11-16 MED ORDER — MIRTAZAPINE 30 MG PO TABS
30.0000 mg | ORAL_TABLET | Freq: Every day | ORAL | Status: DC
  Administered 2020-11-16: 30 mg via ORAL
  Filled 2020-11-16: qty 7
  Filled 2020-11-16: qty 1

## 2020-11-16 NOTE — Progress Notes (Signed)
Patient pleasant attends groups, appropriate with staff and other peer on unit. Patient's appetite is is good, lunch  provided. Nursing staff will continue to monitor.

## 2020-11-16 NOTE — ED Notes (Signed)
Patient denies SI,HI,AVH. Patient is cooperative and interacts well with staff and peers. Pt is interacting well in groups. Respiratory is even and unlabored. No distress noted. Patient stated no complaints at present. will continue to monitor for safety.

## 2020-11-16 NOTE — ED Notes (Signed)
Pt asleep in bed. Respirations even and unlabored. Will continue to monitor for safety. ?

## 2020-11-16 NOTE — ED Provider Notes (Signed)
Behavioral Health Progress Note  Date and Time: 11/15/2020 11:19 AM Name: Tom Davenport MRN:  [id number]  Subjective:   40 year old male with history of alcohol abuse, depression, anxiety and self-reported bipolar disorder who presented to the [facility] on 11/7 with passive suicidal ideations and desire for alcohol treatment.  Patient was admitted to the [facility] for alcohol detox and further treatment.  He was started on Ativan taper and alcohol withdrawal protocol.  Patient seen and chart reviewed-patient has been medication compliant, has been attending groups and has been appropriate with staff and peers on the unit. Most recent CIWA 0.  Patient interviewed in his room this morning, he is calm, cooperative and pleasant.   Patient interviewed this morning in conjunction with social work.  Patient describes his mood as "fine".  He reports sleeping well overall but describes not feeling well rested as he has several periods of nighttime awakening.  Discussed increasing medication-patient is agreeable.  Patient denies issues with appetite.  Patient denies alcohol withdrawal symptoms of tremor, headache, GI upset, nausea, vomiting, and anxiety.  He denies SI/HI/AVH.  Discussed outpatient follow-up regarding substance use treatment.  Patient is amenable to outpatient resources; however, he is not interested in residential treatment.  Social work discussed recommendations for outpatient follow-up with patient and informed him that they will be in his AVS.  Patient requests letter for work to return Monday.   Final diagnoses:  Alcohol use disorder, severe, dependence (HCC)  Alcohol-induced mood disorder with depressive symptoms (HCC)  Passive suicidal ideations    Total Time spent with patient: 15 minutes   Past Medical History: History reviewed. No pertinent past medical history. History reviewed. No pertinent surgical history. Family History: History reviewed. No pertinent family history.  Social  History:  Social History   Substance and Sexual Activity  Alcohol Use None     Social History   Substance and Sexual Activity  Drug Use Not on file    Social History   Socioeconomic History   Marital status: Single    Spouse name: Not on file   Number of children: Not on file   Years of education: Not on file   Highest education level: Not on file  Occupational History   Not on file  Tobacco Use   Smoking status: Not on file   Smokeless tobacco: Not on file  Substance and Sexual Activity   Alcohol use: Not on file   Drug use: Not on file   Sexual activity: Not on file  Other Topics Concern   Not on file  Social History Narrative   Not on file   Social Determinants of Health   Financial Resource Strain: Not on file  Food Insecurity: Not on file  Transportation Needs: Not on file  Physical Activity: Not on file  Stress: Not on file  Social Connections: Not on file   SDOH:  SDOH Screenings   Alcohol Screen: Not on file  Depression (PHQ2-9): Not on file  Financial Resource Strain: Not on file  Food Insecurity: Not on file  Housing: Not on file  Physical Activity: Not on file  Social Connections: Not on file  Stress: Not on file  Tobacco Use: Not on file  Transportation Needs: Not on file   Additional Social History:    Pain Medications: See MAR Prescriptions: See MAR Over the Counter: See MAR History of alcohol / drug use?: Yes Longest period of sobriety (when/how long): 1 year Name of Substance 1: ETOH 1 - Age of  First Use: Teenage 1 - Amount (size/oz): 6 pack on weekdays, 2 18 pack of beer on weekends 1 - Frequency: daily 1 - Duration: 6 months                  Sleep: Fair  Appetite:  Fair  Current Medications:  Current Facility-Administered Medications  Medication Dose Route Frequency Provider Last Rate Last Admin   acetaminophen (TYLENOL) tablet 650 mg  650 mg Oral Q6H PRN Rankin, Shuvon B, NP       alum & mag hydroxide-simeth  (MAALOX/MYLANTA) 200-200-20 MG/5ML suspension 30 mL  30 mL Oral Q4H PRN Rankin, Shuvon B, NP       aspirin EC tablet 81 mg  81 mg Oral Daily Estella Husk, MD   81 mg at 11/15/20 0947   hydrOXYzine (ATARAX/VISTARIL) tablet 25 mg  25 mg Oral Q6H PRN Rankin, Shuvon B, NP       loperamide (IMODIUM) capsule 2-4 mg  2-4 mg Oral PRN Rankin, Shuvon B, NP       LORazepam (ATIVAN) tablet 1 mg  1 mg Oral Q6H PRN Rankin, Shuvon B, NP       LORazepam (ATIVAN) tablet 1 mg  1 mg Oral TID Rankin, Shuvon B, NP   1 mg at 11/15/20 0947   Followed by   Melene Muller ON 11/16/2020] LORazepam (ATIVAN) tablet 1 mg  1 mg Oral BID Rankin, Shuvon B, NP       Followed by   Melene Muller ON 11/17/2020] LORazepam (ATIVAN) tablet 1 mg  1 mg Oral Daily Rankin, Shuvon B, NP       magnesium hydroxide (MILK OF MAGNESIA) suspension 30 mL  30 mL Oral Daily PRN Rankin, Shuvon B, NP       mirtazapine (REMERON) tablet 15 mg  15 mg Oral QHS Estella Husk, MD   15 mg at 11/14/20 2114   multivitamin with minerals tablet 1 tablet  1 tablet Oral Daily Rankin, Shuvon B, NP   1 tablet at 11/15/20 0948   nicotine (NICODERM CQ - dosed in mg/24 hours) patch 21 mg  21 mg Transdermal Daily Rankin, Shuvon B, NP   21 mg at 11/15/20 0948   ondansetron (ZOFRAN-ODT) disintegrating tablet 4 mg  4 mg Oral Q6H PRN Rankin, Shuvon B, NP       thiamine tablet 100 mg  100 mg Oral Daily Rankin, Shuvon B, NP   100 mg at 11/15/20 0948   traZODone (DESYREL) tablet 50 mg  50 mg Oral QHS PRN Rankin, Shuvon B, NP   50 mg at 11/14/20 2114   No current outpatient medications on file.    Labs  Lab Results:  Admission on 11/13/2020  Component Date Value Ref Range Status   SARS Coronavirus 2 by RT PCR 11/13/2020 NEGATIVE  NEGATIVE Final   Comment: (NOTE) SARS-CoV-2 target nucleic acids are NOT DETECTED.  The SARS-CoV-2 RNA is generally detectable in upper respiratory specimens during the acute phase of infection. The lowest concentration of SARS-CoV-2 viral  copies this assay can detect is 138 copies/mL. A negative result does not preclude SARS-Cov-2 infection and should not be used as the sole basis for treatment or other patient management decisions. A negative result may occur with  improper specimen collection/handling, submission of specimen other than nasopharyngeal swab, presence of viral mutation(s) within the areas targeted by this assay, and inadequate number of viral copies(<138 copies/mL). A negative result must be combined with clinical observations, patient history, and epidemiological information.  The expected result is Negative.  Fact Sheet for Patients:  BloggerCourse.com  Fact Sheet for Healthcare Providers:  SeriousBroker.it  This test is no                          t yet approved or cleared by the [place] FDA and  has been authorized for detection and/or diagnosis of SARS-CoV-2 by FDA under an Emergency Use Authorization (EUA). This EUA will remain  in effect (meaning this test can be used) for the duration of the COVID-19 declaration under Section 564(b)(1) of the Act, 21 U.S.C.section 360bbb-3(b)(1), unless the authorization is terminated  or revoked sooner.       Influenza A by PCR 11/13/2020 NEGATIVE  NEGATIVE Final   Influenza B by PCR 11/13/2020 NEGATIVE  NEGATIVE Final   Comment: (NOTE) The Xpert Xpress SARS-CoV-2/FLU/RSV plus assay is intended as an aid in the diagnosis of influenza from Nasopharyngeal swab specimens and should not be used as a sole basis for treatment. Nasal washings and aspirates are unacceptable for Xpert Xpress SARS-CoV-2/FLU/RSV testing.  Fact Sheet for Patients: BloggerCourse.com  Fact Sheet for Healthcare Providers: SeriousBroker.it  This test is not yet approved or cleared by the [place] FDA and has been authorized for detection and/or diagnosis of SARS-CoV-2 by FDA  under an Emergency Use Authorization (EUA). This EUA will remain in effect (meaning this test can be used) for the duration of the COVID-19 declaration under Section 564(b)(1) of the Act, 21 U.S.C. section 360bbb-3(b)(1), unless the authorization is terminated or revoked.  Performed at [facility], [address], [place], [place] [address]    SARS Coronavirus 2 Ag 11/13/2020 Negative  Negative Final   WBC 11/13/2020 8.0  4.0 - 10.5 K/uL Final   RBC 11/13/2020 4.83  4.22 - 5.81 MIL/uL Final   Hemoglobin 11/13/2020 16.0  13.0 - 17.0 g/dL Final   HCT 60/45/4098 46.7  39.0 - 52.0 % Final   MCV 11/13/2020 96.7  80.0 - 100.0 fL Final   MCH 11/13/2020 33.1  26.0 - 34.0 pg Final   MCHC 11/13/2020 34.3  30.0 - 36.0 g/dL Final   RDW 11/91/4782 12.7  11.5 - 15.5 % Final   Platelets 11/13/2020 207  150 - 400 K/uL Final   nRBC 11/13/2020 0.0  0.0 - 0.2 % Final   Neutrophils Relative % 11/13/2020 63  % Final   Neutro Abs 11/13/2020 5.0  1.7 - 7.7 K/uL Final   Lymphocytes Relative 11/13/2020 29  % Final   Lymphs Abs 11/13/2020 2.4  0.7 - 4.0 K/uL Final   Monocytes Relative 11/13/2020 6  % Final   Monocytes Absolute 11/13/2020 0.4  0.1 - 1.0 K/uL Final   Eosinophils Relative 11/13/2020 2  % Final   Eosinophils Absolute 11/13/2020 0.2  0.0 - 0.5 K/uL Final   Basophils Relative 11/13/2020 0  % Final   Basophils Absolute 11/13/2020 0.0  0.0 - 0.1 K/uL Final   Immature Granulocytes 11/13/2020 0  % Final   Abs Immature Granulocytes 11/13/2020 0.03  0.00 - 0.07 K/uL Final   Performed at [facility], [address], [place], [place] [address]   Sodium 11/13/2020 141  135 - 145 mmol/L Final   Potassium 11/13/2020 4.1  3.5 - 5.1 mmol/L Final   Chloride 11/13/2020 106  98 - 111 mmol/L Final   CO2 11/13/2020 26  22 - 32 mmol/L Final   Glucose, Bld 11/13/2020 88  70 - 99 mg/dL Final   Glucose reference range applies only to samples taken after fasting for at least 8 hours.   BUN 11/13/2020  9  6 - 20 mg/dL Final   Creatinine, Ser 11/13/2020 0.87  0.61 - 1.24 mg/dL Final   Calcium 40/10/2723 9.1  8.9 - 10.3 mg/dL Final   Total Protein 36/64/4034 6.7  6.5 - 8.1 g/dL Final   Albumin 74/25/9563 4.0  3.5 - 5.0 g/dL Final   AST 87/56/4332 22  15 - 41 U/L Final   ALT 11/13/2020 15  0 - 44 U/L Final   Alkaline Phosphatase 11/13/2020 63  38 - 126 U/L Final   Total Bilirubin 11/13/2020 0.4  0.3 - 1.2 mg/dL Final   GFR, Estimated 11/13/2020 >60  >60 mL/min Final   Comment: (NOTE) Calculated using the CKD-EPI Creatinine Equation (2021)    Anion gap 11/13/2020 9  5 - 15 Final   Performed at [facility], [address] [place], [place] [address]   Hgb A1c MFr Bld 11/13/2020 5.4  4.8 - 5.6 % Final   Comment: (NOTE) Pre diabetes:          5.7%-6.4%  Diabetes:              >6.4%  Glycemic control for   <7.0% adults with diabetes    Mean Plasma Glucose 11/13/2020 108.28  mg/dL Final   Performed at [facility], [address] [place], [place] [address]   Magnesium 11/13/2020 2.4  1.7 - 2.4 mg/dL Final   Performed at [facility], [address] [place], [place] [address]   Alcohol, Ethyl (B) 11/13/2020 13 (A)  <10 mg/dL Final   Comment: (NOTE) Lowest detectable limit for serum alcohol is 10 mg/dL.  For medical purposes only. Performed at [facility], [address] [place], [place] [address]    Cholesterol 11/13/2020 195  0 - 200 mg/dL Final   Triglycerides 23/55/7322 129  <150 mg/dL Final   HDL 02/54/2706 89  >40 mg/dL Final   Total CHOL/HDL Ratio 11/13/2020 2.2  RATIO Final   VLDL 11/13/2020 26  0 - 40 mg/dL Final   LDL Cholesterol 11/13/2020 80  0 - 99 mg/dL Final   Comment:        Total Cholesterol/HDL:CHD Risk Coronary Heart Disease Risk Table                     Men   Women  1/2 Average Risk   3.4   3.3  Average Risk       5.0   4.4  2 X Average Risk   9.6   7.1  3 X Average Risk  23.4   11.0        Use the calculated Patient  Ratio above and the CHD Risk Table to determine the patient's CHD Risk.        ATP III CLASSIFICATION (LDL):  <100     mg/dL   Optimal  237-628  mg/dL   Near or Above                    Optimal  130-159  mg/dL   Borderline  315-176  mg/dL   High  >160     mg/dL   Very High Performed at [facility], [address], [place], [place] [address]    TSH 11/13/2020 0.771  0.350 - 4.500 uIU/mL Final  Comment: Performed by a 3rd Generation assay with a functional sensitivity of <=0.01 uIU/mL. Performed at [facility], [address] [place], [place] [address]    Color, Urine 11/13/2020 YELLOW  YELLOW Final   APPearance 11/13/2020 CLEAR  CLEAR Final   Specific Gravity, Urine 11/13/2020 1.017  1.005 - 1.030 Final   pH 11/13/2020 5.0  5.0 - 8.0 Final   Glucose, UA 11/13/2020 NEGATIVE  NEGATIVE mg/dL Final   Hgb urine dipstick 11/13/2020 NEGATIVE  NEGATIVE Final   Bilirubin Urine 11/13/2020 NEGATIVE  NEGATIVE Final   Ketones, ur 11/13/2020 NEGATIVE  NEGATIVE mg/dL Final   Protein, ur 60/45/4098 NEGATIVE  NEGATIVE mg/dL Final   Nitrite 11/91/4782 NEGATIVE  NEGATIVE Final   Leukocytes,Ua 11/13/2020 NEGATIVE  NEGATIVE Final   Performed at [facility], [address] [place], [place] [address]   POC Amphetamine UR 11/13/2020 None Detected  NONE DETECTED (Cut Off Level 1000 ng/mL) Final   POC Secobarbital (BAR) 11/13/2020 None Detected  NONE DETECTED (Cut Off Level 300 ng/mL) Final   POC Buprenorphine (BUP) 11/13/2020 None Detected  NONE DETECTED (Cut Off Level 10 ng/mL) Final   POC Oxazepam (BZO) 11/13/2020 None Detected  NONE DETECTED (Cut Off Level 300 ng/mL) Final   POC Cocaine UR 11/13/2020 None Detected  NONE DETECTED (Cut Off Level 300 ng/mL) Final   POC Methamphetamine UR 11/13/2020 None Detected  NONE DETECTED (Cut Off Level 1000 ng/mL) Final   POC Morphine 11/13/2020 None Detected  NONE DETECTED (Cut Off Level 300 ng/mL) Final   POC Oxycodone UR 11/13/2020 None  Detected  NONE DETECTED (Cut Off Level 100 ng/mL) Final   POC Methadone UR 11/13/2020 None Detected  NONE DETECTED (Cut Off Level 300 ng/mL) Final   POC Marijuana UR 11/13/2020 None Detected  NONE DETECTED (Cut Off Level 50 ng/mL) Final   SARSCOV2ONAVIRUS 2 AG 11/13/2020 NEGATIVE  NEGATIVE Final   Comment: (NOTE) SARS-CoV-2 antigen NOT DETECTED.   Negative results are presumptive.  Negative results do not preclude SARS-CoV-2 infection and should not be used as the sole basis for treatment or other patient management decisions, including infection  control decisions, particularly in the presence of clinical signs and  symptoms consistent with COVID-19, or in those who have been in contact with the virus.  Negative results must be combined with clinical observations, patient history, and epidemiological information. The expected result is Negative.  Fact Sheet for Patients: https://www.jennings-kim.com/  Fact Sheet for Healthcare Providers: https://alexander-rogers.biz/  This test is not yet approved or cleared by the [place] FDA and  has been authorized for detection and/or diagnosis of SARS-CoV-2 by FDA under an Emergency Use Authorization (EUA).  This EUA will remain in effect (meaning this test can be used) for the duration of  the COV                          ID-19 declaration under Section 564(b)(1) of the Act, 21 U.S.C. section 360bbb-3(b)(1), unless the authorization is terminated or revoked sooner.      Blood Alcohol level:  Lab Results  Component Value Date   ETH 13 (H) 11/13/2020    Metabolic Disorder Labs: Lab Results  Component Value Date   HGBA1C 5.4 11/13/2020   MPG 108.28 11/13/2020   No results found for: PROLACTIN Lab Results  Component Value Date   CHOL 195 11/13/2020   TRIG 129 11/13/2020   HDL 89 11/13/2020   CHOLHDL 2.2 11/13/2020  VLDL 26 11/13/2020   LDLCALC 80 11/13/2020    Therapeutic Lab Levels: No  results found for: LITHIUM No results found for: VALPROATE No components found for:  CBMZ  Physical Findings   Flowsheet Row ED from 11/13/2020 in [facility]  C-SSRS RISK CATEGORY Moderate Risk        Musculoskeletal  Strength & Muscle Tone: within normal limits Gait & Station: normal Patient leans: N/A  Psychiatric Specialty Exam  Presentation  General Appearance: Appropriate for Environment; Casual  Eye Contact:Good  Speech:Clear and Coherent; Normal Rate  Speech Volume:Normal  Handedness:Right   Mood and Affect  Mood:Euthymic  Affect:Appropriate; Congruent   Thought Process  Thought Processes:Coherent; Goal Directed; Linear  Descriptions of Associations:Intact  Orientation:Full (Time, Place and Person)  Thought Content:WDL; Logical  Diagnosis of Schizophrenia or Schizoaffective disorder in past: No    Hallucinations:Hallucinations: None  Ideas of Reference:None  Suicidal Thoughts:Suicidal Thoughts: No  Homicidal Thoughts:Homicidal Thoughts: No   Sensorium  Memory:Immediate Good; Recent Good; Remote Good  Judgment:Fair  Insight:Fair   Executive Functions  Concentration:Good  Attention Span:Good  Recall:Good  Fund of Knowledge:Good  Language:Good   Psychomotor Activity  Psychomotor Activity:Psychomotor Activity: Normal   Assets  Assets:Communication Skills; Desire for Improvement; Vocational/Educational; Social Support   Sleep  Sleep:Sleep: Fair   No data recorded   Physical Exam  Physical Exam Constitutional:      Appearance: Normal appearance. He is normal weight.  HENT:     Head: Normocephalic and atraumatic.  Eyes:     Extraocular Movements: Extraocular movements intact.  Pulmonary:     Effort: Pulmonary effort is normal.  Neurological:     General: No focal deficit present.     Mental Status: He is alert and oriented to person, place, and time.  Psychiatric:        Attention and  Perception: Attention and perception normal.        Speech: Speech normal.        Behavior: Behavior normal. Behavior is cooperative.        Thought Content: Thought content normal.   Review of Systems  Constitutional:  Negative for chills and fever.  HENT:  Negative for hearing loss.   Eyes:  Negative for discharge and redness.  Respiratory:  Negative for cough.   Cardiovascular:  Negative for chest pain.  Gastrointestinal:  Negative for abdominal pain.  Musculoskeletal:  Negative for myalgias.  Neurological:  Negative for headaches.  Psychiatric/Behavioral:  Positive for substance abuse. Negative for hallucinations and suicidal ideas.   Blood pressure 122/79, pulse 67, temperature (!) 97.5 F (36.4 C), temperature source Tympanic, resp. rate 16, SpO2 99 %. There is no height or weight on file to calculate BMI.  Treatment Plan Summary: 40 year old male with history of alcohol abuse, depression, anxiety and self-reported bipolar disorder who presented to the [facility] on 11/7 with passive suicidal ideations and desire for alcohol treatment.  Patient was admitted to the [facility] for alcohol detox and further treatment.  He was started on Ativan taper and alcohol withdrawal protocol.  Most recent CIWA was 0; patient has not received any as needed Ativan for elevated CIWA scores.  TPatient  AUD -patient reports daily drinking for the last 6 months and was started on alcohol detox protocol- ativan 1 mg QID, 1 mg tID, 1 mg BID, 1 mg -CIWA protocol -patient currently does not express interested in substance use follow up-will continue to assess while at the [facility]-expressed possible interest in outpatient substance  use treatment-SW assisting  Substance induced mood disorder vs unspecified mood disorder vs mdd -started Remeron 15 mg qhs for mood, anxiety, insomnia on11/8 --> increased to 30 mg 11/10 -monitor depression ssx/and assess for AE/SE of medications  History of blood clot in lower  extremity -Patient reports a history of a blood clot in his leg and states that he was instructed to take aspirin daily. -start ASA 81 mg daily  Dispo:ongoing. SW assisting. Likely back to residence. Ativan taper is scheduled to end Friday-likely discharge Friday after finishing taper.   Estella Husk, MD 11/15/2020 11:19 AM

## 2020-11-16 NOTE — ED Notes (Signed)
Patient is alert and oriented X 3, denies Si, HI and AVH at this time. Patient is pleasant with calm affect. Pain 0/10. Patient has no objective signs of visual hallucinations, not responding to internal stimuli. Nursing staff will continue to monitor.

## 2020-11-16 NOTE — ED Notes (Signed)
Patient resting at this time with normal respirations, no objective signs of discomfort. Nursing staff will continue to monitor.

## 2020-11-16 NOTE — Clinical Social Work Psych Note (Signed)
CSW Update Note  Tom Davenport reports that he feels "fine" today. He reports sleeping "okay", however stated he continuously tossed and turned last night due to the bed being uncomfortable. Tom Davenport was pleasant and cooperative with providing information.   He denied having any SI, HI or AVH. He also denied having any withdrawal symptoms currently.   Tom Davenport reports that he is ready to discharge tomorrow once his Ativan taper is completed. Tom Davenport states that he is returning home with his nephew and his nephew's grandmother at discharge.   Tom Davenport reports he plans to return to work on Monday, 11/20/2020. CSW/MD will provide a "return to work" note.   Tom Davenport reports he he will have someone pick him up for discharge.   CSW will continue to follow until discharge.    Baldo Daub, MSW, LCSW Clinical Child psychotherapist (Facility Based Crisis) [facility]

## 2020-11-16 NOTE — ED Notes (Signed)
Pt interacting well in AA group.

## 2020-11-17 DIAGNOSIS — R45851 Suicidal ideations: Secondary | ICD-10-CM | POA: Diagnosis not present

## 2020-11-17 DIAGNOSIS — F1024 Alcohol dependence with alcohol-induced mood disorder: Secondary | ICD-10-CM | POA: Diagnosis not present

## 2020-11-17 DIAGNOSIS — F1020 Alcohol dependence, uncomplicated: Secondary | ICD-10-CM | POA: Diagnosis not present

## 2020-11-17 DIAGNOSIS — Z20822 Contact with and (suspected) exposure to covid-19: Secondary | ICD-10-CM | POA: Diagnosis not present

## 2020-11-17 NOTE — Clinical Social Work Psych Note (Signed)
CSW Discharge Note  Pacen reports "feeling good this morning". He denied having any SI, HI or AVH. He also denied having any withdrawal symptoms at this time.    Sheamus shared that he still plans to return home with his nephew and his nephew's grandmother.   Arash  shared that his mother was picking him up at discharge around 10:00am.    Nadav was agreeable to outpatient resources for medication management, therapy and substance abuse counseling services. CSW listed resources in the patient's AVS.     Xylon requested a letter for work, stating he could return on Monday. CSW provided two copies of the note, and placed it along with his other discharge paperwork.    Marquell denied having any additional questions or concerns.   CSW signing off    Baldo Daub, MSW, LCSW Clinical Child psychotherapist (Facility Based Crisis) [facility]

## 2020-11-17 NOTE — ED Provider Notes (Signed)
FBC/OBS ASAP Discharge Summary  Date and Time: 11/17/2020 9:49 AM  Name: Tom Davenport  MRN:  [id number]   Discharge Diagnoses:  Final diagnoses:  Alcohol use disorder, severe, dependence (HCC)  Alcohol-induced mood disorder with depressive symptoms (HCC)  Passive suicidal ideations    Subjective:  Patient seen and chart reviewed.  Patient has been attending groups, been medication compliant and has not been a management issue on the unit.  Patient interviewed in the cafeteria this morning.  Patient describes his mood as "good".  And rates it as 6 or 7 out of 10 (10 being the best.  Patient denies SI/HI/AVH.  He denies alcohol withdrawal symptoms of headache, nausea, vomiting, GI upset, tremor and anxiety.  Patient denies all physical complaints this morning.  Patient verbalizes that he feels ready to discharge.  Patient states that he would like to present upstairs for psychiatric follow-up and requests to be provided with resources for substance use upon discharge   Stay summary: Patient presented to the [facility] see on 11/7 reporting complaints of alcohol use disorder and depression.  As well as passive suicidal thoughts.  Patient was admitted to the [facility] and started on a Ativan taper to assist with withdrawal.  Patient was started on 15 mg Remeron nightly for mood, anxiety and insomnia on 11/8.  This dose was increased to 30 mg on 11/10.  Patient denied side effects/adverse events to medication.  Ativan taper ended on the morning of 11/11.  By day of discharge alcohol withdrawal symptoms completely resolved and patient verbalized readiness for discharge.  See above for additional information-patient denied SI/HI/AVH and was requesting outpatient resources.  Patient attended groups and was appropriate with staff and peers throughout his stay at the [facility].  Patient reported improved mood by day of discharge and was no longer experiencing passive suicidal thoughts. Total Time spent with patient: 15  minutes  Past Psychiatric History: bipolar disorder, polysusbtance abuse etoh use, depression Past Medical History: History reviewed. No pertinent past medical history. History reviewed. No pertinent surgical history. Family History: History reviewed. No pertinent family history. Family Psychiatric History: see H&P Social History:  Social History   Substance and Sexual Activity  Alcohol Use None     Social History   Substance and Sexual Activity  Drug Use Not on file    Social History   Socioeconomic History   Marital status: Single    Spouse name: Not on file   Number of children: Not on file   Years of education: Not on file   Highest education level: Not on file  Occupational History   Not on file  Tobacco Use   Smoking status: Not on file   Smokeless tobacco: Not on file  Substance and Sexual Activity   Alcohol use: Not on file   Drug use: Not on file   Sexual activity: Not on file  Other Topics Concern   Not on file  Social History Narrative   Not on file   Social Determinants of Health   Financial Resource Strain: Not on file  Food Insecurity: Not on file  Transportation Needs: Not on file  Physical Activity: Not on file  Stress: Not on file  Social Connections: Not on file   SDOH:  SDOH Screenings   Alcohol Screen: Not on file  Depression (GEX5-2): Not on file  Financial Resource Strain: Not on file  Food Insecurity: Not on file  Housing: Not on file  Physical Activity: Not on file  Social  Connections: Not on file  Stress: Not on file  Tobacco Use: Not on file  Transportation Needs: Not on file    Tobacco Cessation:  Prescription not provided because: not interested  Current Medications:  Current Facility-Administered Medications  Medication Dose Route Frequency Provider Last Rate Last Admin   acetaminophen (TYLENOL) tablet 650 mg  650 mg Oral Q6H PRN Rankin, Shuvon B, NP       alum & mag hydroxide-simeth (MAALOX/MYLANTA) 200-200-20 MG/5ML  suspension 30 mL  30 mL Oral Q4H PRN Rankin, Shuvon B, NP       aspirin EC tablet 81 mg  81 mg Oral Daily Estella Husk, MD   81 mg at 11/17/20 5885   magnesium hydroxide (MILK OF MAGNESIA) suspension 30 mL  30 mL Oral Daily PRN Rankin, Shuvon B, NP       mirtazapine (REMERON) tablet 30 mg  30 mg Oral QHS Estella Husk, MD   30 mg at 11/16/20 2115   multivitamin with minerals tablet 1 tablet  1 tablet Oral Daily Rankin, Shuvon B, NP   1 tablet at 11/17/20 0929   nicotine (NICODERM CQ - dosed in mg/24 hours) patch 21 mg  21 mg Transdermal Daily Rankin, Shuvon B, NP   21 mg at 11/16/20 0942   thiamine tablet 100 mg  100 mg Oral Daily Rankin, Shuvon B, NP   100 mg at 11/17/20 0929   traZODone (DESYREL) tablet 50 mg  50 mg Oral QHS PRN Rankin, Shuvon B, NP   50 mg at 11/16/20 2115   Current Outpatient Medications  Medication Sig Dispense Refill   aspirin EC 81 MG EC tablet Take 1 tablet (81 mg total) by mouth daily. Swallow whole. 30 tablet 0   mirtazapine (REMERON) 30 MG tablet Take 1 tablet (30 mg total) by mouth at bedtime. 30 tablet 0   traZODone (DESYREL) 50 MG tablet Take 1 tablet (50 mg total) by mouth at bedtime as needed for sleep. 30 tablet 0    PTA Medications: (Not in a hospital admission)   Musculoskeletal  Strength & Muscle Tone: within normal limits Gait & Station: normal Patient leans: N/A  Psychiatric Specialty Exam  Presentation  General Appearance: Appropriate for Environment; Casual  Eye Contact:Good  Speech:Clear and Coherent; Normal Rate  Speech Volume:Normal  Handedness:Right   Mood and Affect  Mood:-- ("good")  Affect:Appropriate; Congruent   Thought Process  Thought Processes:Coherent; Goal Directed; Linear  Descriptions of Associations:Intact  Orientation:Full (Time, Place and Person)  Thought Content:WDL; Logical  Diagnosis of Schizophrenia or Schizoaffective disorder in past: No    Hallucinations:Hallucinations: None  Ideas  of Reference:None  Suicidal Thoughts:Suicidal Thoughts: No  Homicidal Thoughts:Homicidal Thoughts: No   Sensorium  Memory:Recent Good; Remote Good; Immediate Good  Judgment:Fair  Insight:Fair   Executive Functions  Concentration:Good  Attention Span:Good  Recall:Good  Fund of Knowledge:Good  Language:Good   Psychomotor Activity  Psychomotor Activity:Psychomotor Activity: Normal   Assets  Assets:Communication Skills; Desire for Improvement; Social Support   Sleep  Sleep:Sleep: Fair   No data recorded  Physical Exam  Physical Exam Constitutional:      Appearance: Normal appearance. He is normal weight.  HENT:     Head: Normocephalic and atraumatic.  Eyes:     Extraocular Movements: Extraocular movements intact.  Pulmonary:     Effort: Pulmonary effort is normal.  Neurological:     General: No focal deficit present.     Mental Status: He is alert and oriented to person,  place, and time.  Psychiatric:        Attention and Perception: Attention and perception normal.        Speech: Speech normal.        Behavior: Behavior normal. Behavior is cooperative.        Thought Content: Thought content normal.   Review of Systems  Constitutional:  Negative for chills and fever.  HENT:  Negative for hearing loss.   Eyes:  Negative for discharge and redness.  Respiratory:  Negative for cough.   Cardiovascular:  Negative for chest pain.  Gastrointestinal:  Negative for abdominal pain.  Musculoskeletal:  Negative for myalgias.  Neurological:  Negative for headaches.  Psychiatric/Behavioral:  Positive for substance abuse. Negative for suicidal ideas.   Blood pressure 101/69, pulse 64, temperature (!) 97.5 F (36.4 C), temperature source Tympanic, resp. rate 18, SpO2 100 %. There is no height or weight on file to calculate BMI.  Demographic Factors:  Male and Caucasian  Loss Factors: Loss of significant relationship  Historical Factors: Impulsivity  Risk  Reduction Factors:   Sense of responsibility to family, Employed, Living with another person, especially a relative, and Positive social support  Continued Clinical Symptoms:  Depression:   Comorbid alcohol abuse/dependence Alcohol/Substance Abuse/Dependencies  Cognitive Features That Contribute To Risk:  None    Suicide Risk:  Minimal: No identifiable suicidal ideation.  Patients presenting with no risk factors but with morbid ruminations; may be classified as minimal risk based on the severity of the depressive symptoms  Plan Of Care/Follow-up recommendations:  Activity:  as tolerated Diet:  regular Other:     Patient is instructed prior to discharge to: Take all medications as prescribed by his/her mental healthcare provider. Report any adverse effects and or reactions from the medicines to his/her outpatient provider promptly. Patient has been instructed & cautioned: To not engage in alcohol and or illegal drug use while on prescription medicines. In the event of worsening symptoms, patient is instructed to call the crisis hotline, 911 and or go to the nearest ED for appropriate evaluation and treatment of symptoms. To follow-up with his/her primary care provider for your other medical issues, concerns and or health care needs.    Allergies as of 11/17/2020       Reactions   Sodium Nitrate    Headache        Medication List     TAKE these medications    aspirin 81 MG EC tablet Take 1 tablet (81 mg total) by mouth daily. Swallow whole.   mirtazapine 30 MG tablet Commonly known as: REMERON Take 1 tablet (30 mg total) by mouth at bedtime.   traZODone 50 MG tablet Commonly known as: DESYREL Take 1 tablet (50 mg total) by mouth at bedtime as needed for sleep.       Patient provided with 7-day samples of above medications and was provided a paper prescription for 30 days worth of medications.  Disposition: self care.      Estella Husk, MD 11/17/2020,  9:49 AM

## 2020-11-17 NOTE — Progress Notes (Signed)
Mother called and stated she was supposed to come and get him and wondered what time.  Patient resting in bed.  Permission received to talk with mother.  Denied SI, HI, AVH.  Stated he feels safe and ready for discharge.  Continue to monitor for safety.

## 2020-11-17 NOTE — ED Notes (Signed)
Pt eating breakfast 

## 2020-11-17 NOTE — ED Notes (Signed)
Pt is in the bed sleeping. Respirations are even and unlabored. No acute distress noted. Will continue to monitor for safety. 

## 2020-11-17 NOTE — Progress Notes (Signed)
AVS, prescriptions, and samples reviewed with patient and all questions answered.  Patient verbalized understanding of information presented.  Patient denied SI, HI, AVH.  Stated he feels safe and ready for discharge.  Patient ambulated to lobby independently without issue.  Discharged in stable condition; no acute distress noted.

## 2020-11-20 ENCOUNTER — Telehealth (HOSPITAL_COMMUNITY): Payer: Self-pay

## 2020-11-20 NOTE — BH Assessment (Signed)
Care Management - Follow Up [facility] Discharges   Writer attempted to make contact with patient today and was unsuccessful.  Patient voicemail was not set up.    Per chart review, patient was given outpatient resources.

## 2021-06-02 ENCOUNTER — Other Ambulatory Visit: Payer: Self-pay

## 2021-06-02 ENCOUNTER — Emergency Department (HOSPITAL_COMMUNITY)
Admission: EM | Admit: 2021-06-02 | Discharge: 2021-06-02 | Disposition: A | Discharge status: Home / Self Care | Payer: BC Managed Care – PPO | Attending: Emergency Medicine | Admitting: Emergency Medicine

## 2021-06-02 ENCOUNTER — Encounter (HOSPITAL_COMMUNITY): Payer: Self-pay

## 2021-06-02 DIAGNOSIS — G510 Bell's palsy: Secondary | ICD-10-CM | POA: Diagnosis present

## 2021-06-02 DIAGNOSIS — R29818 Other symptoms and signs involving the nervous system: Secondary | ICD-10-CM | POA: Insufficient documentation

## 2021-06-02 LAB — CBG MONITORING, ED: Glucose-Capillary: 89 mg/dL (ref 70–99)

## 2021-06-02 MED ORDER — ACYCLOVIR 200 MG PO CAPS
400.0000 mg | ORAL_CAPSULE | Freq: Once | ORAL | Status: AC
  Administered 2021-06-02: 400 mg via ORAL
  Filled 2021-06-02 (×2): qty 2

## 2021-06-02 MED ORDER — ARTIFICIAL TEARS OPHTHALMIC OINT: TOPICAL_OINTMENT | Freq: Every evening | OPHTHALMIC | 1 refills | Status: DC

## 2021-06-02 MED ORDER — ARTIFICIAL TEARS OPHTHALMIC OINT: TOPICAL_OINTMENT | Freq: Every evening | OPHTHALMIC | 1 refills | Status: AC

## 2021-06-02 MED ORDER — FLUORESCEIN SODIUM 1 MG OP STRP
1.0000 | ORAL_STRIP | Freq: Once | OPHTHALMIC | Status: AC
  Administered 2021-06-02: 1 via OPHTHALMIC
  Filled 2021-06-02: qty 1

## 2021-06-02 MED ORDER — PREDNISONE 10 MG PO TABS
60.0000 mg | ORAL_TABLET | Freq: Every day | ORAL | 0 refills | Status: AC
Start: 2021-06-02 — End: 2021-06-09

## 2021-06-02 MED ORDER — PREDNISONE 20 MG PO TABS
60.0000 mg | ORAL_TABLET | Freq: Once | ORAL | Status: AC
Start: 2021-06-02 — End: 2021-06-02
  Administered 2021-06-02: 60 mg via ORAL
  Filled 2021-06-02: qty 3

## 2021-06-02 MED ORDER — ARTIFICIAL TEARS OPHTHALMIC OINT
1.0000 "application " | TOPICAL_OINTMENT | Freq: Once | OPHTHALMIC | Status: AC
  Administered 2021-06-02: 1 via OPHTHALMIC
  Filled 2021-06-02 (×2): qty 3.5

## 2021-06-02 MED ORDER — ACYCLOVIR 400 MG PO TABS: 400.0000 mg | ORAL_TABLET | Freq: Every day | ORAL | 0 refills | Status: AC

## 2021-06-02 NOTE — ED Provider Notes (Signed)
[facility] EMERGENCY DEPARTMENT Provider Note  CSN: [id number] Arrival date & time: 06/02/21 0046  Chief Complaint(s) Neurologic Problem  HPI Tom Davenport is a 41 y.o. male    The history is provided by the patient.  Neurologic Problem This is a new problem. The current episode started 12 to 24 hours ago. The problem occurs constantly. The problem has not changed since onset.Pertinent negatives include no chest pain, no abdominal pain, no headaches and no shortness of breath. Nothing aggravates the symptoms. Nothing relieves the symptoms. He has tried nothing for the symptoms.   Past Medical History No past medical history on file. Patient Active Problem List   Diagnosis Date Noted   Alcohol use disorder, severe, dependence ([place]) 11/13/2020   Alcohol-induced mood disorder with depressive symptoms ([place]) 11/13/2020   Passive suicidal ideations 11/13/2020   Home Medication(s) Prior to Admission medications   Medication Sig Start Date End Date Taking? Authorizing Provider  acyclovir (ZOVIRAX) 400 MG tablet Take 1 tablet (400 mg total) by mouth 5 (five) times daily for 7 days. 06/02/21 06/09/21 Yes Dago Jungwirth, Grayce Sessions, MD  predniSONE (DELTASONE) 10 MG tablet Take 6 tablets (60 mg total) by mouth daily for 7 days. 06/02/21 06/09/21 Yes Fenix Ruppe, Grayce Sessions, MD  artificial tears (LACRILUBE) OINT ophthalmic ointment Place into both eyes at bedtime. 06/02/21 07/02/21  Fatima Blank, MD  mirtazapine (REMERON) 30 MG tablet Take 1 tablet (30 mg total) by mouth at bedtime. 11/16/20 12/16/20  Ival Bible, MD  traZODone (DESYREL) 50 MG tablet Take 1 tablet (50 mg total) by mouth at bedtime as needed for sleep. 11/16/20 12/16/20  Ival Bible, MD                                                                                                                                    Allergies Sodium nitrate  Review of Systems Review of Systems  Respiratory:   Negative for shortness of breath.   Cardiovascular:  Negative for chest pain.  Gastrointestinal:  Negative for abdominal pain.  Neurological:  Negative for headaches.  As noted in HPI  Physical Exam Vital Signs  I have reviewed the triage vital signs BP 135/88 (BP Location: Right Arm)   Pulse 66   Temp 98.6 F (37 C) (Oral)   Resp 16   SpO2 100%   Physical Exam Vitals reviewed.  Constitutional:      General: He is not in acute distress.    Appearance: He is well-developed. He is not diaphoretic.  HENT:     Head: Normocephalic and atraumatic.     Right Ear: External ear normal.     Left Ear: External ear normal.     Nose: Nose normal.     Mouth/Throat:     Mouth: Mucous membranes are moist.  Eyes:     General: No scleral icterus.    Conjunctiva/sclera:     Right  eye: Right conjunctiva is injected.     Left eye: Left conjunctiva is not injected.     Pupils: Pupils are equal, round, and reactive to light.     Right eye: No corneal abrasion or fluorescein uptake.  Neck:     Trachea: Phonation normal.  Cardiovascular:     Rate and Rhythm: Normal rate and regular rhythm.  Pulmonary:     Effort: Pulmonary effort is normal. No respiratory distress.     Breath sounds: No stridor.  Abdominal:     General: There is no distension.  Musculoskeletal:        General: Normal range of motion.     Cervical back: Normal range of motion.  Neurological:     Mental Status: He is alert and oriented to person, place, and time.     Comments: Mental Status:  Alert and oriented to person, place, and time.  Attention and concentration normal.  Speech clear.  Recent memory is intact  Cranial Nerves:  II Visual Fields: Intact to confrontation. Visual fields intact. III, IV, VI: Pupils equal and reactive to light and near. Full eye movement without nystagmus  V Facial Sensation: Normal. No weakness of masticatory muscles  VII: right sided facial droop involving the forehead VIII Auditory  Acuity: Grossly normal  IX/X: The uvula is midline; the palate elevates symmetrically  XI: Normal sternocleidomastoid and trapezius strength  XII: The tongue is midline. No atrophy or fasciculations.   Motor System: Muscle Strength: 5/5 and symmetric in the upper and lower extremities. No pronation or drift.  Muscle Tone: Tone and muscle bulk are normal in the upper and lower extremities.  Coordination: Intact finger-to-nose.No tremor.  Sensation: Intact to light touch Gait: Routine gait normal.   Psychiatric:        Behavior: Behavior normal.    ED Results and Treatments Labs (all labs ordered are listed, but only abnormal results are displayed) Labs Reviewed  CBG MONITORING, ED                                                                                                                         EKG  EKG Interpretation  Date/Time:  Saturday Jun 02 2021 01:07:14 EDT Ventricular Rate:  69 PR Interval:  140 QRS Duration: 100 QT Interval:  386 QTC Calculation: 413 R Axis:   96 Text Interpretation: Normal sinus rhythm Rightward axis Borderline ECG When compared with ECG of 13-Nov-2020 17:24, PREVIOUS ECG IS PRESENT No acute changes Confirmed by Addison Lank [phone]) on 06/02/2021 1:34:25 AM       Radiology No results found.  Pertinent labs & imaging results that were available during my care of the patient were reviewed by me and considered in my medical decision making (see MDM for details).  Medications Ordered in ED Medications  fluorescein ophthalmic strip 1 strip (has no administration in time range)  predniSONE (DELTASONE) tablet 60 mg (has no administration in time range)  acyclovir (ZOVIRAX) 200 MG capsule  400 mg (has no administration in time range)  artificial tears (LACRILUBE) ophthalmic ointment 1 application. (has no administration in time range)                                                                                                                                      Procedures Procedures  (including critical care time)  Medical Decision Making / ED Course    Complexity of Problem:   Patient's presenting problem/concern, DDX, and MDM listed below: Right facial droop Consistent with Bell's palsy. No other acute deficits concerning for CVA. We will assess blood sugars to rule out diabetic induced neuropathy  Hospitalization Considered:  No  Initial Intervention:  N/A    Complexity of Data:    Laboratory Tests ordered listed below with my independent interpretation: CBC within normal limits      ED Course:    Assessment, Add'l Intervention, and Reassessment: Bell's palsy No corneal abrasions Patient provided with Lacri-Lube, steroids and acyclovir Recommended ophthalmology follow-up Establish care with PCP    Final Clinical Impression(s) / ED Diagnoses Final diagnoses:  Bell's palsy   The patient appears reasonably screened and/or stabilized for discharge and I doubt any other medical condition or other [facility] requiring further screening, evaluation, or treatment in the ED at this time prior to discharge. Safe for discharge with strict return precautions.  Disposition: Discharge  Condition: Good  I have discussed the results, Dx and Tx plan with the patient/family who expressed understanding and agree(s) with the plan. Discharge instructions discussed at length. The patient/family was given strict return precautions who verbalized understanding of the instructions. No further questions at time of discharge.    ED Discharge Orders          Ordered    predniSONE (DELTASONE) 10 MG tablet  Daily        06/02/21 0129    acyclovir (ZOVIRAX) 400 MG tablet  5 times daily        06/02/21 0129    artificial tears (LACRILUBE) OINT ophthalmic ointment  Nightly,   Status:  Discontinued        06/02/21 0129    artificial tears (LACRILUBE) OINT ophthalmic ointment  Nightly        06/02/21 0129            Follow  Up: Lonia Skinner, MD [place] [address] [phone]  Call  to schedule an appointment for close follow up  Primary care provider  Schedule an appointment as soon as possible for a visit  if you do not have a primary care physician, contact [facility] at [phone] for referral           This chart was dictated using voice recognition software.  Despite best efforts to proofread,  errors can occur which can change the documentation meaning.    Fatima Blank, MD 06/02/21 [phone]

## 2021-06-02 NOTE — ED Triage Notes (Signed)
Pt reported to ED with c/o facial numbness to right side of face, ear pain and consistently watering of eye that occurred upon awakening Friday 06/01/2021. LKW was Thursday evening around 11pm.

## 2021-06-02 NOTE — Discharge Instructions (Addendum)
Eye Protection:  Cornea eye protection (Level X)  Artificial tears every hour while patient is awake Ophthalmic ointment at night Eye should be taped shut at night Protective glasses or goggles

## 2021-06-02 NOTE — ED Triage Notes (Signed)
Dr.Cardama to triage to evaluate pt. No code stroke orders needed per provider.

## 2021-06-02 NOTE — ED Notes (Signed)
Medications requested from pharmacy via Rx button at this time.
